# Patient Record
Sex: Male | Born: 1935 | Race: White | Hispanic: No
Health system: Southern US, Community
[De-identification: ages and names within clinical notes are randomized; demographics above are authoritative.]

## PROBLEM LIST (undated history)

## (undated) DIAGNOSIS — H9190 Unspecified hearing loss, unspecified ear: Secondary | ICD-10-CM

## (undated) DIAGNOSIS — R011 Cardiac murmur, unspecified: Secondary | ICD-10-CM

## (undated) DIAGNOSIS — I350 Nonrheumatic aortic (valve) stenosis: Secondary | ICD-10-CM

## (undated) DIAGNOSIS — I77810 Thoracic aortic ectasia: Secondary | ICD-10-CM

## (undated) DIAGNOSIS — R42 Dizziness and giddiness: Secondary | ICD-10-CM

## (undated) DIAGNOSIS — E039 Hypothyroidism, unspecified: Secondary | ICD-10-CM

## (undated) DIAGNOSIS — N400 Enlarged prostate without lower urinary tract symptoms: Secondary | ICD-10-CM

## (undated) DIAGNOSIS — I779 Disorder of arteries and arterioles, unspecified: Secondary | ICD-10-CM

## (undated) DIAGNOSIS — I10 Essential (primary) hypertension: Secondary | ICD-10-CM

## (undated) DIAGNOSIS — R350 Frequency of micturition: Secondary | ICD-10-CM

## (undated) DIAGNOSIS — L740 Miliaria rubra: Secondary | ICD-10-CM

## (undated) DIAGNOSIS — M1990 Unspecified osteoarthritis, unspecified site: Secondary | ICD-10-CM

## (undated) DIAGNOSIS — I2510 Atherosclerotic heart disease of native coronary artery without angina pectoris: Secondary | ICD-10-CM

## (undated) DIAGNOSIS — Z8679 Personal history of other diseases of the circulatory system: Secondary | ICD-10-CM

## (undated) DIAGNOSIS — E785 Hyperlipidemia, unspecified: Secondary | ICD-10-CM

## (undated) DIAGNOSIS — E782 Mixed hyperlipidemia: Secondary | ICD-10-CM

## (undated) HISTORY — DX: Essential (primary) hypertension: I10

## (undated) HISTORY — DX: Personal history of other diseases of the circulatory system: Z86.79

## (undated) HISTORY — DX: Hyperlipidemia, unspecified: E78.5

## (undated) HISTORY — DX: Disorder of arteries and arterioles, unspecified: I77.9

## (undated) HISTORY — DX: Benign prostatic hyperplasia without lower urinary tract symptoms: N40.0

## (undated) HISTORY — DX: Mixed hyperlipidemia: E78.2

## (undated) HISTORY — DX: Hypothyroidism, unspecified: E03.9

## (undated) HISTORY — PX: CARDIAC VALVE REPLACEMENT: SHX585

## (undated) HISTORY — DX: Thoracic aortic ectasia: I77.810

---

## 2007-03-13 ENCOUNTER — Ambulatory Visit: Payer: Self-pay | Admitting: Cardiology

## 2008-06-25 ENCOUNTER — Ambulatory Visit: Payer: Self-pay | Admitting: Urology

## 2008-12-31 DIAGNOSIS — I10 Essential (primary) hypertension: Secondary | ICD-10-CM

## 2008-12-31 DIAGNOSIS — E785 Hyperlipidemia, unspecified: Secondary | ICD-10-CM | POA: Insufficient documentation

## 2010-05-31 HISTORY — PX: CARDIAC CATHETERIZATION: SHX172

## 2010-10-13 NOTE — Assessment & Plan Note (Signed)
[facility] OFFICE NOTE   BELEN, ZWAHLEN                           MRN:          [id number]  DATE:03/13/2007                            DOB:          [birth year]    CHIEF COMPLAINT:  I have got a heart murmur.   HISTORY OF PRESENT ILLNESS:  Mr. Ksiazek is a delightful, 77 year old,  married, white male from [place] who comes today with  the above complaint.  He is totally asymptomatic at present.  He is very  active and very vigorous.  He has no dyspnea on exertion, syncope,  presyncope, tachy palpitations, or angina.  He has no history of valve  disease and no history of rheumatic heart disease.   PAST MEDICAL HISTORY:  He does not smoke, drink, use recreational  products.  His thyroid panel was recently checked and was normal, CBC,  electrolytes included.  His total cholesterol is 205, LDL 149, HDL 37,  triglycerides 97.  He is currently on Lisinopril/HCTZ 20/12.5 daily for  hypertension.  He is on levothyroxine for thyroid replacement at 50 mcg  per day. He has no known drug allergies.   PREVIOUS SURGERIES:  None.   FAMILY HISTORY:  Negative for premature heart disease.   SOCIAL HISTORY:  He is currently retired from [employer].  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  He is married and has  2 children.   REVIEW OF SYSTEMS:  He does have a history of sexual dysfunction and has  thyroid disease and hypertension, otherwise negative review of systems.   PHYSICAL EXAMINATION:  VITAL SIGNS:  His blood pressure is 152/94.  His  pulse is 65 and regular.  He is in a sinus rhythm.  He has got minor  criteria for LVH.  He has nonspecific ST segment changes in I and aVL.  HEENT:  Normocephalic atraumatic.  PERRLA.  Extraocular movements  intact.  Sclerae are clear.  Facial symmetry is normal.  Dentition is  satisfactory.  NECK:  Carotids upstrokes are equal bilaterally with  bilateral referred  sounds.  Thyroid is not enlarged.  Trachea is midline.  There is no JVD.  LUNGS:  Clear to auscultation.  HEART:  Reveals a poorly appreciated PMI.  He has a 3/6 systolic murmur  consistent with aortic stenosis.  I could not hear any aortic  insufficiency.  There is no S4 gallop.  There is no sustained pulse and  there is no delayed carotid up pulse.  His S2 does split  physiologically.  ABDOMEN:  Soft.  Good bowel sounds.  No midline bruit.  EXTREMITIES:  Reveal no cyanosis, clubbing, or edema.  Pulses are  intact.  NEUROLOGIC:  Intact.   ASSESSMENT:  1. Probable mild to moderate aortic stenosis, currently asymptomatic.  2. Hypertension.  3. Mild hyperlipidemia.   He has had a 2D echocardiogram at Dr. Carron Brazen office.  We will obtain  the written report.  Assuming that it is consistent with what I am  hearing today, we will see him back in  6 months and then annually  thereafter.     Thomas C. Daleen Squibb, MD, [facility]  Electronically Signed    TCW/MedQ  DD: 03/13/2007  DT: 03/13/2007  Job #: [id number]   cc:   Evelene Croon

## 2010-11-20 ENCOUNTER — Encounter: Payer: Self-pay | Admitting: Cardiovascular Disease

## 2011-03-15 ENCOUNTER — Ambulatory Visit: Payer: Self-pay | Admitting: Family Medicine

## 2011-03-17 ENCOUNTER — Encounter: Payer: Self-pay | Admitting: Cardiovascular Disease

## 2011-03-18 ENCOUNTER — Ambulatory Visit: Payer: Self-pay | Admitting: Cardiovascular Disease

## 2011-04-01 ENCOUNTER — Ambulatory Visit (INDEPENDENT_AMBULATORY_CARE_PROVIDER_SITE_OTHER): Payer: Medicare Other | Admitting: Cardiovascular Disease

## 2011-04-01 DIAGNOSIS — I350 Nonrheumatic aortic (valve) stenosis: Secondary | ICD-10-CM

## 2011-04-01 DIAGNOSIS — I10 Essential (primary) hypertension: Secondary | ICD-10-CM

## 2011-04-01 DIAGNOSIS — R42 Dizziness and giddiness: Secondary | ICD-10-CM

## 2011-04-01 DIAGNOSIS — I359 Nonrheumatic aortic valve disorder, unspecified: Secondary | ICD-10-CM

## 2011-04-01 DIAGNOSIS — R011 Cardiac murmur, unspecified: Secondary | ICD-10-CM

## 2011-04-01 DIAGNOSIS — I739 Peripheral vascular disease, unspecified: Secondary | ICD-10-CM | POA: Insufficient documentation

## 2011-04-01 MED ORDER — LISINOPRIL 20 MG PO TABS: 20.0000 mg | ORAL_TABLET | Freq: Two times a day (BID) | ORAL | Status: DC

## 2011-04-01 NOTE — Assessment & Plan Note (Signed)
Given the finding of mild to moderate calcific plaquing his carotid, he will probably need to be started on a cholesterol medication with goal LDL less than 70. We did not start this today.

## 2011-04-01 NOTE — Patient Instructions (Addendum)
You are doing well. Please stop the lisinopril HCTZ Start lisinopril 20 mg once a day, increase to twice a day if needed for high blood pressure  Please call us if you have new issues that need to be addressed before your next appt.  The office will contact you for a follow up after the echo  Your physician has requested that you have an echocardiogram. Echocardiography is a painless test that uses sound waves to create images of your heart. It provides your doctor with information about the size and shape of your heart and how well your heart's chambers and valves are working. This procedure takes approximately one hour. There are no restrictions for this procedure.

## 2011-04-01 NOTE — Assessment & Plan Note (Signed)
He does report having episodes of cramping as well as his dizziness. Uncertain if the HCTZ could be contributing to both of these side effects. He does not drink much at baseline raising the concern for orthostasis. We have suggested he try lisinopril 20 mg, possibly taking extra lisinopril if needed for hypertension. Prescription was written for 20 mg b.i.d. With close monitoring of his blood pressure.

## 2011-04-01 NOTE — Progress Notes (Signed)
   Patient ID: Andre Evans, male    DOB: Sep 14, 1933, 77 y.o.   MRN: [id number]  HPI Comments: Andre Evans is a very pleasant 77 year old gentleman, patient of Dr. Burnett Sheng, with a history of hypertension and hypothyroidism with recent episodes of dizziness and murmur as well as carotid bruit.  He reports having a carotid ultrasound that showed mild to moderate calcific plaque on the left with small plaque on the right. No significant stenoses.  He has had episodes of dizziness starting initially several years ago and then again several months ago. Some of these episodes seem to be positional such as when he looks upwards or when he bends over and stands up. The last episode of dizziness was more than one month ago and he describes it as a lightheadedness and not a spinning.   He does not drink much in the daytime and has significant leg cramps at nighttime. Last episode of cramping was 2 weeks ago. His wife is always trying to get him to drink more.   He reports that his blood pressure is sometimes in the 120 range, occasionally lower. He has been more compliant with his lisinopril HCTZ.  Previous EKG shows normal sinus rhythm with nonspecific ST changes to the anterior precordial leads, ST and T wave abnormality in one and aVL, left axis deviation   Outpatient Encounter Prescriptions as of 04/01/2011  Medication Sig Dispense Refill  .  lisinopril-hydrochlorothiazide (PRINZIDE,ZESTORETIC) 20-12.5 MG per tablet Take 1 tablet by mouth 2 (two) times daily.           Review of Systems  Constitutional: Negative.   HENT: Negative.   Eyes: Negative.   Respiratory: Negative.   Cardiovascular: Negative.   Gastrointestinal: Negative.   Musculoskeletal: Positive for myalgias.       Leg cramps  Skin: Negative.   Neurological: Positive for dizziness.  Hematological: Negative.   Psychiatric/Behavioral: Negative.   All other systems reviewed and are negative.    BP 130/84  Pulse 66  Ht 5\' 6"   (1.676 m)  Wt 175 lb 6.4 oz (79.561 kg)  BMI 28.31 kg/m2  Physical Exam  Nursing note and vitals reviewed. Constitutional: He is oriented to person, place, and time. He appears well-developed and well-nourished.  HENT:  Head: Normocephalic.  Nose: Nose normal.  Mouth/Throat: Oropharynx is clear and moist.  Eyes: Conjunctivae are normal. Pupils are equal, round, and reactive to light.  Neck: Normal range of motion. Neck supple. No JVD present. Carotid bruit is present.  Cardiovascular: Normal rate, regular rhythm, S1 normal, S2 normal and intact distal pulses.  Exam reveals no gallop and no friction rub.   Murmur heard.  Crescendo systolic murmur is present with a grade of 3/6  Pulmonary/Chest: Effort normal and breath sounds normal. No respiratory distress. He has no wheezes. He has no rales. He exhibits no tenderness.  Abdominal: Soft. Bowel sounds are normal. He exhibits no distension. There is no tenderness.  Musculoskeletal: Normal range of motion. He exhibits no edema and no tenderness.  Lymphadenopathy:    He has no cervical adenopathy.  Neurological: He is alert and oriented to person, place, and time. Coordination normal.  Skin: Skin is warm and dry. No rash noted. No erythema.  Psychiatric: He has a normal mood and affect. His behavior is normal. Judgment and thought content normal.           Assessment and Plan

## 2011-04-01 NOTE — Assessment & Plan Note (Signed)
Etiology of his dizzy episodes are uncertain. Unable to exclude arrhythmia though his symptoms are very infrequent with last episode more than one month ago. There is a atypical component to it consistent with benign positional vertigo he denies any spinning. I'm also concerned about his murmur which is consistent with aortic valve disease. Echocardiogram has been ordered.  If he continues to have episodes of dizziness, we will probably recommend a Holter monitor ( if his aortic valve disease is not causing his symptoms).

## 2011-04-01 NOTE — Assessment & Plan Note (Signed)
Prominent murmur consistent with aortic valve disease. Echocardiogram has been ordered.

## 2011-04-12 ENCOUNTER — Encounter: Payer: Self-pay | Admitting: Cardiovascular Disease

## 2011-04-13 ENCOUNTER — Other Ambulatory Visit (INDEPENDENT_AMBULATORY_CARE_PROVIDER_SITE_OTHER): Payer: Medicare Other | Admitting: *Deleted

## 2011-04-13 DIAGNOSIS — I359 Nonrheumatic aortic valve disorder, unspecified: Secondary | ICD-10-CM

## 2011-04-13 DIAGNOSIS — R011 Cardiac murmur, unspecified: Secondary | ICD-10-CM

## 2011-04-13 DIAGNOSIS — R42 Dizziness and giddiness: Secondary | ICD-10-CM

## 2011-04-13 DIAGNOSIS — I350 Nonrheumatic aortic (valve) stenosis: Secondary | ICD-10-CM

## 2011-04-20 ENCOUNTER — Encounter: Payer: Self-pay | Admitting: *Deleted

## 2011-04-20 ENCOUNTER — Encounter: Payer: Self-pay | Admitting: Cardiovascular Disease

## 2011-04-20 ENCOUNTER — Ambulatory Visit (INDEPENDENT_AMBULATORY_CARE_PROVIDER_SITE_OTHER): Payer: Medicare Other | Admitting: Cardiovascular Disease

## 2011-04-20 DIAGNOSIS — I10 Essential (primary) hypertension: Secondary | ICD-10-CM

## 2011-04-20 DIAGNOSIS — I359 Nonrheumatic aortic valve disorder, unspecified: Secondary | ICD-10-CM

## 2011-04-20 DIAGNOSIS — E785 Hyperlipidemia, unspecified: Secondary | ICD-10-CM

## 2011-04-20 DIAGNOSIS — I739 Peripheral vascular disease, unspecified: Secondary | ICD-10-CM

## 2011-04-20 DIAGNOSIS — I350 Nonrheumatic aortic (valve) stenosis: Secondary | ICD-10-CM | POA: Insufficient documentation

## 2011-04-20 NOTE — Assessment & Plan Note (Signed)
Recent echocardiogram suggests severe aortic valve stenosis. We have discussed the options with him and we will schedule a transesophageal echo to confirm the stenosis prior to any further workup. If the TEE does show severe stenosis, the next procedure would be a cardiac catheterization prior to any possible valve surgery.

## 2011-04-20 NOTE — Progress Notes (Signed)
   Patient ID: Andre Evans, male    DOB: 23-Mar-1934, 77 y.o.   MRN: [id number]  HPI Comments: Andre Evans is a very pleasant 77 year old gentleman, patient of Dr. Burnett Evans, with a history of hypertension and hypothyroidism with recent episodes of dizziness and murmur as well as carotid bruit. He presents for followup after his echocardiogram.  He reports having a carotid ultrasound that showed mild to moderate calcific plaque on the left with small plaque on the right. No significant stenoses.  We did make some medication changes on his last clinic visit and stop his HCTZ secondary to cramping. He believes that the cramping has improved in his legs.  The echocardiogram did suggest severe aortic valve stenosis with elevated velocity, aortic valve area less than 1 cm. This was reviewed with him and images were shown to him. The valve is very calcified and leaflets are restricted. He denies any significant shortness of breath with exertion. He does not participate in an exercise program. Dizziness and lightheadedness has improved.  Previous EKG shows normal sinus rhythm with nonspecific ST changes to the anterior precordial leads, ST and T wave abnormality in one and aVL, left axis deviation   Outpatient Encounter Prescriptions as of 04/20/2011  Medication Sig Dispense Refill  . lisinopril (PRINIVIL,ZESTRIL) 20 MG tablet Take 1 tablet (20 mg total) by mouth 2 (two) times daily.  60 tablet  11    Review of Systems  Constitutional: Negative.   HENT: Negative.   Eyes: Negative.   Respiratory: Negative.   Cardiovascular: Negative.   Gastrointestinal: Negative.   Musculoskeletal:       Leg cramps  Skin: Negative.   Hematological: Negative.   Psychiatric/Behavioral: Negative.   All other systems reviewed and are negative.    BP 132/82  Pulse 66  Ht 5\' 6"  (1.676 m)  Wt 179 lb (81.194 kg)  BMI 28.89 kg/m2  Physical Exam  Nursing note and vitals reviewed. Constitutional: He is oriented to  person, place, and time. He appears well-developed and well-nourished.  HENT:  Head: Normocephalic.  Nose: Nose normal.  Mouth/Throat: Oropharynx is clear and moist.  Eyes: Conjunctivae are normal. Pupils are equal, round, and reactive to light.  Neck: Normal range of motion. Neck supple. No JVD present. Carotid bruit is present.  Cardiovascular: Normal rate, regular rhythm, S1 normal, S2 normal and intact distal pulses.  Exam reveals no gallop and no friction rub.   Murmur heard.  Crescendo systolic murmur is present with a grade of 3/6  Pulmonary/Chest: Effort normal and breath sounds normal. No respiratory distress. He has no wheezes. He has no rales. He exhibits no tenderness.  Abdominal: Soft. Bowel sounds are normal. He exhibits no distension. There is no tenderness.  Musculoskeletal: Normal range of motion. He exhibits no edema and no tenderness.  Lymphadenopathy:    He has no cervical adenopathy.  Neurological: He is alert and oriented to person, place, and time. Coordination normal.  Skin: Skin is warm and dry. No rash noted. No erythema.  Psychiatric: He has a normal mood and affect. His behavior is normal. Judgment and thought content normal.           Assessment and Plan

## 2011-04-20 NOTE — Assessment & Plan Note (Signed)
Blood pressure is relatively well controlled. If he does have severe aortic valve stenosis, he may benefit from low dose beta blocker.

## 2011-04-20 NOTE — Assessment & Plan Note (Signed)
Mild-to-moderate carotid arterial disease. We'll continue aggressive cholesterol management. Goal LDL less than 70.

## 2011-04-20 NOTE — Assessment & Plan Note (Signed)
Given his underlying carotid arterial disease, he would benefit from low-dose cholesterol medication.

## 2011-04-20 NOTE — Patient Instructions (Addendum)
You are doing well. No medication changes were made.  We will schedule you for a TEE at [facility] next week to look at your aortic valve stenosis. Your physician has requested that you have a TEE. During a TEE, sound waves are used to create images of your heart. It provides your doctor with information about the size and shape of your heart and how well your heart's chambers and valves are working. In this test, a transducer is attached to the end of a flexible tube that's guided down your throat and into your esophagus (the tube leading from you mouth to your stomach) to get a more detailed image of your heart. You are not awake for the procedure. Please see the instruction sheet given to you today. For further information please visit [email or web address]   Please call us if you have new issues that need to be addressed before your next appt.  The office will contact you for a follow up Appt. In 6 months

## 2011-04-28 ENCOUNTER — Encounter: Payer: Self-pay | Admitting: *Deleted

## 2011-04-28 ENCOUNTER — Other Ambulatory Visit: Payer: Self-pay | Admitting: Cardiovascular Disease

## 2011-04-28 ENCOUNTER — Telehealth: Payer: Self-pay | Admitting: *Deleted

## 2011-04-28 ENCOUNTER — Ambulatory Visit: Payer: Self-pay | Admitting: Cardiovascular Disease

## 2011-04-28 DIAGNOSIS — I350 Nonrheumatic aortic (valve) stenosis: Secondary | ICD-10-CM

## 2011-04-28 DIAGNOSIS — I359 Nonrheumatic aortic valve disorder, unspecified: Secondary | ICD-10-CM

## 2011-04-28 DIAGNOSIS — Z01810 Encounter for preprocedural cardiovascular examination: Secondary | ICD-10-CM

## 2011-04-28 NOTE — Telephone Encounter (Signed)
Spoke to pt, notified TEE showed severe aortic stenosis per Dr. Mariah Milling. (He had already discussed this after TEE). I have scheduled pt for LHC at [facility] lab and will refer to Dr. Dorris Fetch for valve sx consult, pt aware to come in this Friday 11/30 for labs and procedure instructions.

## 2011-04-30 ENCOUNTER — Ambulatory Visit (INDEPENDENT_AMBULATORY_CARE_PROVIDER_SITE_OTHER): Payer: Medicare Other | Admitting: *Deleted

## 2011-04-30 ENCOUNTER — Encounter: Payer: Medicare Other | Admitting: *Deleted

## 2011-04-30 DIAGNOSIS — Z01810 Encounter for preprocedural cardiovascular examination: Secondary | ICD-10-CM

## 2011-04-30 DIAGNOSIS — I350 Nonrheumatic aortic (valve) stenosis: Secondary | ICD-10-CM

## 2011-04-30 DIAGNOSIS — I359 Nonrheumatic aortic valve disorder, unspecified: Secondary | ICD-10-CM

## 2011-05-01 LAB — BASIC METABOLIC PANEL
CO2: 24 mmol/L (ref 20–32)
Calcium: 9.1 mg/dL (ref 8.6–10.2)
Chloride: 104 mmol/L (ref 97–108)
Glucose: 89 mg/dL (ref 65–99)
Potassium: 4.1 mmol/L (ref 3.5–5.2)
Sodium: 140 mmol/L (ref 134–144)

## 2011-05-01 LAB — PROTIME-INR: INR: 1 (ref 0.8–1.2)

## 2011-05-04 ENCOUNTER — Other Ambulatory Visit: Payer: Self-pay | Admitting: Cardiovascular Disease

## 2011-05-04 ENCOUNTER — Telehealth: Payer: Self-pay | Admitting: *Deleted

## 2011-05-04 DIAGNOSIS — Z01810 Encounter for preprocedural cardiovascular examination: Secondary | ICD-10-CM

## 2011-05-04 DIAGNOSIS — I350 Nonrheumatic aortic (valve) stenosis: Secondary | ICD-10-CM

## 2011-05-04 DIAGNOSIS — I2510 Atherosclerotic heart disease of native coronary artery without angina pectoris: Secondary | ICD-10-CM

## 2011-05-04 NOTE — Telephone Encounter (Signed)
FYI Dr. Burnett Sheng has ordered CT scan for next Tuesday after pt's cath. 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 This is after his cath.

## 2011-05-05 ENCOUNTER — Encounter (HOSPITAL_BASED_OUTPATIENT_CLINIC_OR_DEPARTMENT_OTHER)
Admission: RE | Disposition: A | Discharge status: Home / Self Care | Payer: Self-pay | Source: Ambulatory Visit | Attending: Cardiovascular Disease

## 2011-05-05 ENCOUNTER — Encounter (HOSPITAL_BASED_OUTPATIENT_CLINIC_OR_DEPARTMENT_OTHER): Payer: Self-pay | Admitting: Cardiovascular Disease

## 2011-05-05 ENCOUNTER — Inpatient Hospital Stay (HOSPITAL_BASED_OUTPATIENT_CLINIC_OR_DEPARTMENT_OTHER)
Admission: RE | Admit: 2011-05-05 | Discharge: 2011-05-06 | Disposition: A | Discharge status: Home / Self Care | Payer: Medicare Other | Source: Ambulatory Visit | Attending: Cardiovascular Disease | Admitting: Cardiovascular Disease

## 2011-05-05 DIAGNOSIS — I2510 Atherosclerotic heart disease of native coronary artery without angina pectoris: Secondary | ICD-10-CM

## 2011-05-05 DIAGNOSIS — I350 Nonrheumatic aortic (valve) stenosis: Secondary | ICD-10-CM

## 2011-05-05 DIAGNOSIS — Z01810 Encounter for preprocedural cardiovascular examination: Secondary | ICD-10-CM

## 2011-05-05 DIAGNOSIS — I359 Nonrheumatic aortic valve disorder, unspecified: Secondary | ICD-10-CM

## 2011-05-05 SURGERY — JV LEFT HEART CATHETERIZATION WITH CORONARY ANGIOGRAM
Anesthesia: Moderate Sedation

## 2011-05-05 MED ORDER — ASPIRIN 81 MG PO CHEW
324.0000 mg | CHEWABLE_TABLET | ORAL | Status: AC
  Administered 2011-05-05: 324 mg via ORAL

## 2011-05-05 MED ORDER — SODIUM CHLORIDE 0.9 % IV SOLN
250.0000 mL | INTRAVENOUS | Status: DC | PRN
Start: 2011-05-05 — End: 2011-05-06

## 2011-05-05 MED ORDER — ONDANSETRON HCL 4 MG/2ML IJ SOLN: 4.0000 mg | Freq: Four times a day (QID) | INTRAMUSCULAR | Status: DC | PRN

## 2011-05-05 MED ORDER — ACETAMINOPHEN 325 MG PO TABS: 650.0000 mg | ORAL_TABLET | ORAL | Status: DC | PRN

## 2011-05-05 NOTE — Op Note (Signed)
Cardiac Catheterization Procedure Note  Name: Andre Evans MRN: [id number] DOB: 1933-06-25  Procedure: Left Heart Cath, Selective Coronary Angiography, LV angiography  Indication:  Severe aortic valve stenosis, preop prior to AVR surgery  Procedural details: The right groin was prepped, draped, and anesthetized with 1% lidocaine. Using modified Seldinger technique, a 5 French sheath was introduced into the right femoral artery. Standard Judkins catheters were used for coronary angiography and left ventriculography. Catheter exchanges were performed over a guidewire. There were no immediate procedural complications. The patient was transferred to the post catheterization recovery area for further monitoring.  Procedural Findings:  Hemodynamics:     AO 126/70/92    LV  Aortic valve was not crossed.   Coronary angiography:  Coronary dominance: Right  Left mainstem:   Large vessel with no significant disease. Bifurcates into the left circumflex and LAD.  Left anterior descending (LAD):   Moderate to large size vessel with several diagonal branches. There appears to be mild narrowing consistent with 30-40% proximal to mid LAD disease. Otherwise mild luminal irregularities.  Left circumflex (LCx):  Dominant vessel , Large vessel with several obtuse marginal branches. Mild luminal irregularities.  Right coronary artery (RCA):  Small nondominant vessel with mild luminal irregularities.  very difficult to visualize secondary to aortic root dilatation. Several catheters were used to engage the ostium of the RCA including a JR 4, no torque catheter and finally JR 5. Significant kinking of the catheters given tortuosity in the right iliac vessel and the dilated descending aorta.   Left ventriculography: Aortic valve was not crossed with a catheter given the severe stenosis. Aortic root: The aortic root, ascending aorta and arch appears dilated  Final Conclusions:   No significant coronary artery  disease. Mild proximal and mid LAD disease estimated at 30%. Mild to Moderately dilated aortic root and ascending aorta.  Severe aortic valve stenosis by history with recent transthoracic echo and transesophageal echo at [facility]. Estimated aortic valve is critical, estimated less than 0.7 cm.  He reports recent carotid ultrasound showing no significant stenoses (done at [facility])  Recommendations:  We'll schedule him for consultation with CT surgery is for a for aortic valve replacement. He is requesting tissue valve to avoid anticoagulation. Our office in [place] we'll help arrange this appointment.  Julien Nordmann 05/05/2011, 1:26 PM

## 2011-05-05 NOTE — H&P (Signed)
Patient ID: Andre Evans, male DOB: 06/10/1933, 77 y.o. MRN: [id number]   HPI Comments: Andre Evans is a very pleasant 77 year old gentleman, patient of Dr. Burnett Sheng, with a history of hypertension and hypothyroidism with recent episodes of dizziness and murmur as well as carotid bruit. He presents for followup after his echocardiogram.  He reports having a carotid ultrasound that showed mild to moderate calcific plaque on the left with small plaque on the right. No significant stenoses.  We did make some medication changes on his last clinic visit and stop his HCTZ secondary to cramping. He believes that the cramping has improved in his legs.  The echocardiogram did suggest severe aortic valve stenosis with elevated velocity, aortic valve area less than 1 cm. This was reviewed with him and images were shown to him. The valve is very calcified and leaflets are restricted. He denies any significant shortness of breath with exertion. He does not participate in an exercise program. Dizziness and lightheadedness has improved.  Previous EKG shows normal sinus rhythm with nonspecific ST changes to the anterior precordial leads, ST and T wave abnormality in one and aVL, left axis deviation   Outpatient Encounter Prescriptions as of 04/20/2011   Medication  Sig  Dispense  Refill   .  lisinopril (PRINIVIL,ZESTRIL) 20 MG tablet  Take 1 tablet (20 mg total) by mouth 2 (two) times daily.  60 tablet  11    Review of Systems  Constitutional: Negative.  HENT: Negative.  Eyes: Negative.  Respiratory: Negative.  Cardiovascular: Negative.  Gastrointestinal: Negative.  Musculoskeletal:  Leg cramps  Skin: Negative.  Hematological: Negative.  Psychiatric/Behavioral: Negative.  All other systems reviewed and are negative.   BP 132/82  Pulse 66  Ht 5\' 6"  (1.676 m)  Wt 179 lb (81.194 kg)  BMI 28.89 kg/m2   Physical Exam  Nursing note and vitals reviewed.  Constitutional: He is oriented to person, place,  and time. He appears well-developed and well-nourished.  HENT:  Head: Normocephalic.  Nose: Nose normal.  Mouth/Throat: Oropharynx is clear and moist.  Eyes: Conjunctivae are normal. Pupils are equal, round, and reactive to light.  Neck: Normal range of motion. Neck supple. No JVD present. Carotid bruit is present.  Cardiovascular: Normal rate, regular rhythm, S1 normal, S2 normal and intact distal pulses. Exam reveals no gallop and no friction rub.  Murmur heard.  Crescendo systolic murmur is present with a grade of 3/6  Pulmonary/Chest: Effort normal and breath sounds normal. No respiratory distress. He has no wheezes. He has no rales. He exhibits no tenderness.  Abdominal: Soft. Bowel sounds are normal. He exhibits no distension. There is no tenderness.  Musculoskeletal: Normal range of motion. He exhibits no edema and no tenderness.  Lymphadenopathy:  He has no cervical adenopathy.  Neurological: He is alert and oriented to person, place, and time. Coordination normal.  Skin: Skin is warm and dry. No rash noted. No erythema.  Psychiatric: He has a normal mood and affect. His behavior is normal. Judgment and thought content normal.      Assessment and Plan   Aortic valve stenosis - Julien Nordmann, MD 04/20/2011 7:51 PM Signed  Recent echocardiogram suggests severe aortic valve stenosis. We have discussed the options with him and we will schedule a transesophageal echo to confirm the stenosis prior to any further workup. If the TEE does show severe stenosis, the next procedure would be a cardiac catheterization prior to any possible valve surgery. HTN (hypertension) - Julien Nordmann, MD 04/20/2011  7:52 PM Signed  Blood pressure is relatively well controlled. If he does have severe aortic valve stenosis, he may benefit from low dose beta blocker. PVD (peripheral vascular disease) - Julien Nordmann, MD 04/20/2011 7:52 PM Signed  Mild-to-moderate carotid arterial disease. We'll continue  aggressive cholesterol management. Goal LDL less than 70. HYPERLIPIDEMIA-MIXED Julien Nordmann, MD 04/20/2011 7:52 PM Signed  Given his underlying carotid arterial disease, he would benefit from low-dose cholesterol medication.    Agree with recent H&P above from the office No changes noted

## 2011-05-05 NOTE — OR Nursing (Signed)
Discharge instructions reviewed and signed, pt stated understanding, ambulated in hall without difficulty, site intact, level 0, no bleeding, transported to wife's car via wheelchair 

## 2011-05-05 NOTE — OR Nursing (Signed)
Tegaderm dressing applied, site intact, level 0, no bleeding, bedrest begins at 1335.

## 2011-05-05 NOTE — OR Nursing (Signed)
Dr Gollan at bedside to discuss results and treatment plan with pt and family 

## 2011-05-06 ENCOUNTER — Encounter: Payer: Self-pay | Admitting: Cardiovascular Disease

## 2011-05-19 ENCOUNTER — Other Ambulatory Visit: Payer: Self-pay

## 2011-05-19 ENCOUNTER — Encounter: Payer: Self-pay | Admitting: Thoracic Surgery (Cardiothoracic Vascular Surgery)

## 2011-05-19 ENCOUNTER — Institutional Professional Consult (permissible substitution) (INDEPENDENT_AMBULATORY_CARE_PROVIDER_SITE_OTHER): Payer: Medicare Other | Admitting: Thoracic Surgery (Cardiothoracic Vascular Surgery)

## 2011-05-19 ENCOUNTER — Other Ambulatory Visit: Payer: Self-pay | Admitting: Thoracic Surgery (Cardiothoracic Vascular Surgery)

## 2011-05-19 VITALS — BP 132/82 | HR 70 | Resp 16 | Ht 66.0 in | Wt 175.0 lb

## 2011-05-19 DIAGNOSIS — I359 Nonrheumatic aortic valve disorder, unspecified: Secondary | ICD-10-CM

## 2011-05-19 DIAGNOSIS — I350 Nonrheumatic aortic (valve) stenosis: Secondary | ICD-10-CM | POA: Insufficient documentation

## 2011-05-19 DIAGNOSIS — I2510 Atherosclerotic heart disease of native coronary artery without angina pectoris: Secondary | ICD-10-CM

## 2011-05-19 DIAGNOSIS — E049 Nontoxic goiter, unspecified: Secondary | ICD-10-CM

## 2011-05-19 NOTE — Progress Notes (Signed)
PCP is No primary provider on file. Referring Provider is Mariah Milling, Tollie Pizza, MD  Chief Complaint  Patient presents with  . Aortic Stenosis    eval and treat---ECHO,TEE, HEART CATH    HPI: 77 year old gentleman with a history of heart murmur. Has been followed for this for several years. Recently been experiencing lightheadedness and dizziness with exertion he noted this particular after he had carried some heavy objects. He is also noted some shortness of breath with exertion recently which is new as well. He saw Dr. Burnett Sheng and Dr. Mariah Milling. An echocardiogram revealed progression of his aortic stenosis. TTE suggested the valve area was less than 1 cm. TEE showed a valve area of 0.7 cm. There was no other significant valvular pathology. Left ventricular function was preserved. He had cardiac catheterization done by Dr. Mariah Milling, which showed no hemodynamically significant coronary artery disease, there was some mild plaquing. Interestingly, the patient says that over the past week or so he has not had any additional symptoms.   Past Medical History  Diagnosis Date  . HTN (hypertension)     unspecified  . HLD (hyperlipidemia)     mixed  . Hemorrhoids   . HTN (hypertension)   . Hypothyroidism   . History of cardiac murmur     with a leaky valve  . BPH (benign prostatic hyperplasia)     No past surgical history on file.  Family History  Problem Relation Age of Onset  . Coronary artery disease      family hx  . Diabetes      family hx  . Heart failure      family hx - CHF  . Diabetes Father   . Hypertension Mother     Social History History  Substance Use Topics  . Smoking status: Never Smoker   . Smokeless tobacco: Not on file   Comment: tobacco use - no  . Alcohol Use: No    Current Outpatient Prescriptions  Medication Sig Dispense Refill  . Alfalfa TABS Take by mouth.        . Calcium-Magnesium-Zinc 1000-400-15 MG TABS Take by mouth.        . Chlorophyll POWD by Does  not apply route.        Marland Kitchen co-enzyme Q-10 30 MG capsule Take 30 mg by mouth 3 (three) times daily.        . fish oil-omega-3 fatty acids 1000 MG capsule Take 1 g by mouth daily.        . Multiple Vitamins-Minerals (ANTIOXIDANTS PROTECTOR) TABS Take by mouth.        . RED YEAST RICE EXTRACT PO Take by mouth.        Marland Kitchen lisinopril (PRINIVIL,ZESTRIL) 20 MG tablet Take 1 tablet (20 mg total) by mouth 2 (two) times daily.  60 tablet  11    No Known Allergies  Review of Systems  Constitutional: Negative.   HENT: Negative.   Eyes: Negative.   Respiratory: Positive for shortness of breath (with exertion). Negative for cough, chest tightness, wheezing and stridor.   Cardiovascular: Negative for chest pain, palpitations and leg swelling.       Heart murmur SOB with exertion   Gastrointestinal:       Constipation  Genitourinary:       Enlarged prostate  Neurological: Positive for dizziness and light-headedness. Negative for syncope.  All other systems reviewed and are negative.    BP 132/82  Pulse 70  Resp 16  Ht 5\' 6"  (1.676  m)  Wt 175 lb (79.379 kg)  BMI 28.25 kg/m2  SpO2 94% Physical Exam  Vitals reviewed. Constitutional: He is oriented to person, place, and time. He appears well-developed and well-nourished. No distress.  HENT:  Head: Normocephalic and atraumatic.  Eyes: EOM are normal. Pupils are equal, round, and reactive to light.  Neck: Neck supple. No tracheal deviation present. No thyromegaly present.       Bilateral bruits v. Transmitted murmur  Cardiovascular: Normal rate and regular rhythm.   Murmur (3/6 c/d high-pitched murmur RUSB> remainder of precordium) heard. Pulmonary/Chest: Effort normal and breath sounds normal. He has no wheezes. He has no rales.  Abdominal: Soft. Bowel sounds are normal.  Musculoskeletal: Normal range of motion. He exhibits no edema.  Lymphadenopathy:    He has no cervical adenopathy.  Neurological: He is alert and oriented to person,  place, and time.       No focal deficits  Skin: Skin is warm and dry.  Psychiatric: He has a normal mood and affect.     Diagnostic Tests: TEE and catheterization reviewed, findings as previously noted.  Impression: 77 year old gentleman with severe aortic stenosis which is now symptomatic. His valve area is 0.7 cm by TEE. Aortic valve replacement is indicated for survival benefit and relief of symptoms. Given his age tissue valve would be the preferred alternative. I did discuss with he and his family the relative advantages and disadvantages of tissue and mechanical valves. He strongly wishes to avoid Coumadin and strongly favors tissue valve.  I have discussed with the patient and his family the general nature of the procedure, need for general anesthesia,and incisions to be used. We did discuss the option of a regular full sternotomy versus a more minimally invasive approach, such as a partial sternotomy or parasternal incision. He favors a conventional approach. I have discussed the expected hospital stay, overall recovery and short and long term outcomes. They understand there is an excellent chance of procedural success They understand the risks include but are not limited to death, stroke, MI, DVT/PE, bleeding, possible need for transfusion, infections,other organ system dysfunction including respiratory, renal, or GI complications. They also understand there is approximately 1% chance of complete heart block requiring permanent pacemaker placement. He understands and accepts these risks and agrees to proceed.   Plan: Aortic valve replacement on Tuesday, January 8. Patient prefers a tissue valve.

## 2011-05-21 ENCOUNTER — Encounter (HOSPITAL_COMMUNITY): Payer: Self-pay | Admitting: Pharmacy Technician

## 2011-06-04 ENCOUNTER — Encounter (HOSPITAL_COMMUNITY)
Admission: RE | Admit: 2011-06-04 | Discharge: 2011-06-04 | Disposition: A | Discharge status: Home / Self Care | Payer: Medicare Other | Source: Ambulatory Visit | Attending: Thoracic Surgery (Cardiothoracic Vascular Surgery) | Admitting: Thoracic Surgery (Cardiothoracic Vascular Surgery)

## 2011-06-04 ENCOUNTER — Inpatient Hospital Stay (HOSPITAL_COMMUNITY)
Admission: RE | Admit: 2011-06-04 | Discharge: 2011-06-04 | Disposition: A | Discharge status: Home / Self Care | Payer: Medicare Other | Source: Ambulatory Visit | Attending: Thoracic Surgery (Cardiothoracic Vascular Surgery) | Admitting: Thoracic Surgery (Cardiothoracic Vascular Surgery)

## 2011-06-04 ENCOUNTER — Encounter (HOSPITAL_COMMUNITY): Payer: Self-pay

## 2011-06-04 ENCOUNTER — Ambulatory Visit (HOSPITAL_COMMUNITY)
Admission: RE | Admit: 2011-06-04 | Discharge: 2011-06-04 | Disposition: A | Discharge status: Home / Self Care | Payer: Medicare Other | Source: Ambulatory Visit | Attending: Thoracic Surgery (Cardiothoracic Vascular Surgery) | Admitting: Thoracic Surgery (Cardiothoracic Vascular Surgery)

## 2011-06-04 ENCOUNTER — Other Ambulatory Visit: Payer: Self-pay

## 2011-06-04 DIAGNOSIS — I517 Cardiomegaly: Secondary | ICD-10-CM | POA: Insufficient documentation

## 2011-06-04 DIAGNOSIS — I10 Essential (primary) hypertension: Secondary | ICD-10-CM | POA: Insufficient documentation

## 2011-06-04 DIAGNOSIS — Z01810 Encounter for preprocedural cardiovascular examination: Secondary | ICD-10-CM | POA: Insufficient documentation

## 2011-06-04 DIAGNOSIS — Z01812 Encounter for preprocedural laboratory examination: Secondary | ICD-10-CM | POA: Insufficient documentation

## 2011-06-04 DIAGNOSIS — Z01818 Encounter for other preprocedural examination: Secondary | ICD-10-CM | POA: Insufficient documentation

## 2011-06-04 DIAGNOSIS — I359 Nonrheumatic aortic valve disorder, unspecified: Secondary | ICD-10-CM

## 2011-06-04 DIAGNOSIS — I350 Nonrheumatic aortic (valve) stenosis: Secondary | ICD-10-CM

## 2011-06-04 HISTORY — DX: Nonrheumatic aortic (valve) stenosis: I35.0

## 2011-06-04 HISTORY — DX: Frequency of micturition: R35.0

## 2011-06-04 HISTORY — DX: Dizziness and giddiness: R42

## 2011-06-04 HISTORY — DX: Unspecified hearing loss, unspecified ear: H91.90

## 2011-06-04 HISTORY — DX: Atherosclerotic heart disease of native coronary artery without angina pectoris: I25.10

## 2011-06-04 HISTORY — DX: Miliaria rubra: L74.0

## 2011-06-04 HISTORY — DX: Unspecified osteoarthritis, unspecified site: M19.90

## 2011-06-04 HISTORY — DX: Cardiac murmur, unspecified: R01.1

## 2011-06-04 LAB — URINALYSIS, ROUTINE W REFLEX MICROSCOPIC
Leukocytes, UA: NEGATIVE
Nitrite: NEGATIVE
Specific Gravity, Urine: 1.025 (ref 1.005–1.030)
Urobilinogen, UA: 0.2 mg/dL (ref 0.0–1.0)

## 2011-06-04 LAB — CBC
MCV: 86.3 fL (ref 78.0–100.0)
Platelets: 135 10*3/uL — ABNORMAL LOW (ref 150–400)
RBC: 4.89 MIL/uL (ref 4.22–5.81)
WBC: 5.1 10*3/uL (ref 4.0–10.5)

## 2011-06-04 LAB — COMPREHENSIVE METABOLIC PANEL
ALT: 18 U/L (ref 0–53)
AST: 18 U/L (ref 0–37)
Alkaline Phosphatase: 61 U/L (ref 39–117)
CO2: 23 mEq/L (ref 19–32)
Chloride: 107 mEq/L (ref 96–112)
Creatinine, Ser: 0.87 mg/dL (ref 0.50–1.35)
GFR calc non Af Amer: 82 mL/min — ABNORMAL LOW (ref >90)
Total Bilirubin: 0.7 mg/dL (ref 0.3–1.2)

## 2011-06-04 LAB — SURGICAL PCR SCREEN
MRSA, PCR: NEGATIVE
Staphylococcus aureus: NEGATIVE

## 2011-06-04 LAB — TYPE AND SCREEN: Antibody Screen: NEGATIVE

## 2011-06-04 LAB — BLOOD GAS, ARTERIAL
Acid-base deficit: 0.1 mmol/L (ref 0.0–2.0)
Drawn by: 344381
TCO2: 24.8 mmol/L (ref 0–100)
pCO2 arterial: 36.2 mmHg (ref 35.0–45.0)
pO2, Arterial: 94.3 mmHg (ref 80.0–100.0)

## 2011-06-04 LAB — APTT: aPTT: 27 seconds (ref 24–37)

## 2011-06-04 NOTE — Progress Notes (Signed)
Heart cath in epic

## 2011-06-04 NOTE — Progress Notes (Signed)
VASCULAR LAB PRELIMINARY  PRELIMINARY  PRELIMINARY  PRELIMINARY  Pre-op Cardiac Surgery  Carotid Findings:  Study done at [facility] 03-15-11.  Right:  Mild plaque at bifurcation.  Peak ICA velocity 58.5cm/sec.  Left:  Mild to moderate plaque at bifurcation.  Peak ICA velocity 68.2cm/sec.  OK not to repeat study per Revonda Standard Tilley/TCTS    Upper Extremity Right Left  Brachial Pressures 159 triphasic 162 triphasic  Radial Waveforms triphasic triphasic  Ulnar Waveforms triphasic triphasic  Palmar Arch (Allen's Test) Within normal limits  Within normal limits     Terance Hart, RVT 06/04/2011, 1:00 PM

## 2011-06-04 NOTE — Progress Notes (Signed)
1200-dopplers done 1300-PFT done

## 2011-06-04 NOTE — Pre-Procedure Instructions (Signed)
20 WRANGLER PENNING  06/04/2011   Your procedure is scheduled on:  Tues,Jan 8 @ 0730 Report to [facility] at 0530 AM.  Call this number if you have problems the morning of surgery: [phone]   Remember:   Do not eat food:After Midnight.  May have clear liquids: up to 4 Hours before arrival.(until 1:30 am)  Clear liquids include soda, tea, black coffee, apple or grape juice, broth.  Take these medicines the morning of surgery with A SIP OF WATER:    Do not wear jewelry, make-up or nail polish.  Do not wear lotions, powders, or perfumes. You may wear deodorant.  Do not shave 48 hours prior to surgery.  Do not bring valuables to the hospital.  Contacts, dentures or bridgework may not be worn into surgery.  Leave suitcase in the car. After surgery it may be brought to your room.  For patients admitted to the hospital, checkout time is 11:00 AM the day of discharge.   Patients discharged the day of surgery will not be allowed to drive home.  Name and phone number of your driver:   Special Instructions: CHG Shower Use Special Wash: 1/2 bottle night before surgery and 1/2 bottle morning of surgery.   Please read over the following fact sheets that you were given: Pain Booklet, Coughing and Deep Breathing, Blood Transfusion Information, Open Heart Packet, MRSA Information and Surgical Site Infection Prevention

## 2011-06-04 NOTE — Progress Notes (Signed)
In [place] with [facility]-Dr.Gollan-last visit 3wks ago  Echo in epic Never had a stress test

## 2011-06-05 LAB — HEMOGLOBIN A1C: Mean Plasma Glucose: 120 mg/dL — ABNORMAL HIGH (ref <117)

## 2011-06-07 MED ORDER — MAGNESIUM SULFATE 50 % IJ SOLN
40.0000 meq | INTRAMUSCULAR | Status: DC
  Filled 2011-06-07: qty 10

## 2011-06-07 MED ORDER — NITROGLYCERIN IN D5W 200-5 MCG/ML-% IV SOLN
2.0000 ug/min | INTRAVENOUS | Status: DC
  Filled 2011-06-07: qty 250

## 2011-06-07 MED ORDER — DEXTROSE 5 % IV SOLN
750.0000 mg | INTRAVENOUS | Status: DC
  Filled 2011-06-07: qty 750

## 2011-06-07 MED ORDER — DEXTROSE 5 % IV SOLN
1.5000 g | INTRAVENOUS | Status: AC
  Administered 2011-06-08: 1.5 g via INTRAVENOUS
  Administered 2011-06-08: .75 g via INTRAVENOUS
  Filled 2011-06-07: qty 1.5

## 2011-06-07 MED ORDER — PHENYLEPHRINE HCL 10 MG/ML IJ SOLN
30.0000 ug/min | INTRAVENOUS | Status: AC
  Administered 2011-06-08: 25 ug/min via INTRAVENOUS
  Filled 2011-06-07: qty 2

## 2011-06-07 MED ORDER — METOPROLOL TARTRATE 12.5 MG HALF TABLET
12.5000 mg | ORAL_TABLET | Freq: Once | ORAL | Status: AC
  Administered 2011-06-08: 12.5 mg via ORAL
  Filled 2011-06-07: qty 1

## 2011-06-07 MED ORDER — POTASSIUM CHLORIDE 2 MEQ/ML IV SOLN
80.0000 meq | INTRAVENOUS | Status: DC
  Filled 2011-06-07: qty 40

## 2011-06-07 MED ORDER — SODIUM CHLORIDE 0.9 % IV SOLN
0.1000 ug/kg/h | INTRAVENOUS | Status: AC
  Administered 2011-06-08: 0.5 ug/kg/h via INTRAVENOUS
  Filled 2011-06-07: qty 4

## 2011-06-07 MED ORDER — SODIUM CHLORIDE 0.9 % IV SOLN
INTRAVENOUS | Status: DC
  Filled 2011-06-07: qty 40

## 2011-06-07 MED ORDER — SODIUM CHLORIDE 0.9 % IV SOLN
INTRAVENOUS | Status: AC
  Administered 2011-06-08: 1 [IU]/h via INTRAVENOUS
  Filled 2011-06-07: qty 1

## 2011-06-07 MED ORDER — PAPAVERINE HCL 30 MG/ML IJ SOLN
INTRAMUSCULAR | Status: AC
  Administered 2011-06-08: 09:00:00
  Filled 2011-06-07: qty 0.5

## 2011-06-07 MED ORDER — DOPAMINE-DEXTROSE 3.2-5 MG/ML-% IV SOLN
2.0000 ug/kg/min | INTRAVENOUS | Status: DC
  Filled 2011-06-07: qty 250

## 2011-06-07 MED ORDER — EPINEPHRINE HCL 1 MG/ML IJ SOLN
0.5000 ug/min | INTRAMUSCULAR | Status: DC
  Filled 2011-06-07: qty 4

## 2011-06-07 MED ORDER — VANCOMYCIN HCL 1000 MG IV SOLR
1250.0000 mg | INTRAVENOUS | Status: AC
  Administered 2011-06-08: 1250 mg via INTRAVENOUS
  Filled 2011-06-07: qty 1250

## 2011-06-08 ENCOUNTER — Ambulatory Visit (HOSPITAL_COMMUNITY): Payer: Medicare Other | Admitting: Anesthesiology

## 2011-06-08 ENCOUNTER — Encounter (HOSPITAL_COMMUNITY): Payer: Self-pay | Admitting: Anesthesiology

## 2011-06-08 ENCOUNTER — Encounter (HOSPITAL_COMMUNITY)
Admission: RE | Disposition: A | Discharge status: Home / Self Care | Payer: Self-pay | Source: Ambulatory Visit | Attending: Thoracic Surgery (Cardiothoracic Vascular Surgery)

## 2011-06-08 ENCOUNTER — Inpatient Hospital Stay (HOSPITAL_COMMUNITY): Payer: Medicare Other

## 2011-06-08 ENCOUNTER — Inpatient Hospital Stay (HOSPITAL_COMMUNITY)
Admission: RE | Admit: 2011-06-08 | Discharge: 2011-06-13 | DRG: 220 | Disposition: A | Discharge status: Home / Self Care | Payer: Medicare Other | Source: Ambulatory Visit | Attending: Thoracic Surgery (Cardiothoracic Vascular Surgery) | Admitting: Thoracic Surgery (Cardiothoracic Vascular Surgery)

## 2011-06-08 ENCOUNTER — Other Ambulatory Visit: Payer: Self-pay | Admitting: Thoracic Surgery (Cardiothoracic Vascular Surgery)

## 2011-06-08 ENCOUNTER — Encounter (HOSPITAL_COMMUNITY): Payer: Self-pay | Admitting: *Deleted

## 2011-06-08 DIAGNOSIS — I10 Essential (primary) hypertension: Secondary | ICD-10-CM | POA: Diagnosis present

## 2011-06-08 DIAGNOSIS — Z8249 Family history of ischemic heart disease and other diseases of the circulatory system: Secondary | ICD-10-CM

## 2011-06-08 DIAGNOSIS — Z79899 Other long term (current) drug therapy: Secondary | ICD-10-CM

## 2011-06-08 DIAGNOSIS — N400 Enlarged prostate without lower urinary tract symptoms: Secondary | ICD-10-CM | POA: Diagnosis present

## 2011-06-08 DIAGNOSIS — E039 Hypothyroidism, unspecified: Secondary | ICD-10-CM | POA: Diagnosis present

## 2011-06-08 DIAGNOSIS — D62 Acute posthemorrhagic anemia: Secondary | ICD-10-CM | POA: Diagnosis not present

## 2011-06-08 DIAGNOSIS — K5900 Constipation, unspecified: Secondary | ICD-10-CM | POA: Diagnosis not present

## 2011-06-08 DIAGNOSIS — Z7901 Long term (current) use of anticoagulants: Secondary | ICD-10-CM

## 2011-06-08 DIAGNOSIS — I359 Nonrheumatic aortic valve disorder, unspecified: Principal | ICD-10-CM | POA: Diagnosis present

## 2011-06-08 DIAGNOSIS — Z7982 Long term (current) use of aspirin: Secondary | ICD-10-CM

## 2011-06-08 DIAGNOSIS — D696 Thrombocytopenia, unspecified: Secondary | ICD-10-CM | POA: Diagnosis not present

## 2011-06-08 DIAGNOSIS — E8779 Other fluid overload: Secondary | ICD-10-CM | POA: Diagnosis not present

## 2011-06-08 DIAGNOSIS — E785 Hyperlipidemia, unspecified: Secondary | ICD-10-CM | POA: Diagnosis present

## 2011-06-08 DIAGNOSIS — Z833 Family history of diabetes mellitus: Secondary | ICD-10-CM

## 2011-06-08 HISTORY — PX: AORTIC VALVE REPLACEMENT: SHX41

## 2011-06-08 LAB — POCT I-STAT 3, ART BLOOD GAS (G3+)
Acid-base deficit: 2 mmol/L (ref 0.0–2.0)
Acid-base deficit: 4 mmol/L — ABNORMAL HIGH (ref 0.0–2.0)
Acid-base deficit: 4 mmol/L — ABNORMAL HIGH (ref 0.0–2.0)
Bicarbonate: 21.3 mEq/L (ref 20.0–24.0)
Bicarbonate: 21.8 mEq/L (ref 20.0–24.0)
Bicarbonate: 22.5 mEq/L (ref 20.0–24.0)
O2 Saturation: 95 %
O2 Saturation: 96 %
O2 Saturation: 98 %
Patient temperature: 35.5
TCO2: 22 mmol/L (ref 0–100)
TCO2: 23 mmol/L (ref 0–100)
TCO2: 30 mmol/L (ref 0–100)
pCO2 arterial: 33.4 mmHg — ABNORMAL LOW (ref 35.0–45.0)
pH, Arterial: 7.55 — ABNORMAL HIGH (ref 7.350–7.450)
pO2, Arterial: 101 mmHg — ABNORMAL HIGH (ref 80.0–100.0)
pO2, Arterial: 81 mmHg (ref 80.0–100.0)
pO2, Arterial: 88 mmHg (ref 80.0–100.0)

## 2011-06-08 LAB — POCT I-STAT, CHEM 8
Chloride: 108 mEq/L (ref 96–112)
Glucose, Bld: 116 mg/dL — ABNORMAL HIGH (ref 70–99)
HCT: 30 % — ABNORMAL LOW (ref 39.0–52.0)
Potassium: 4.1 mEq/L (ref 3.5–5.1)

## 2011-06-08 LAB — POCT I-STAT 4, (NA,K, GLUC, HGB,HCT)
Glucose, Bld: 100 mg/dL — ABNORMAL HIGH (ref 70–99)
Glucose, Bld: 120 mg/dL — ABNORMAL HIGH (ref 70–99)
HCT: 36 % — ABNORMAL LOW (ref 39.0–52.0)
HCT: 31 % — ABNORMAL LOW (ref 39.0–52.0)
HCT: 34 % — ABNORMAL LOW (ref 39.0–52.0)
Hemoglobin: 10.5 g/dL — ABNORMAL LOW (ref 13.0–17.0)
Hemoglobin: 8.5 g/dL — ABNORMAL LOW (ref 13.0–17.0)
Potassium: 3.8 mEq/L (ref 3.5–5.1)
Potassium: 3.6 mEq/L (ref 3.5–5.1)
Potassium: 4.7 mEq/L (ref 3.5–5.1)
Potassium: 4.1 mEq/L (ref 3.5–5.1)
Sodium: 142 mEq/L (ref 135–145)
Sodium: 142 mEq/L (ref 135–145)
Sodium: 140 mEq/L (ref 135–145)

## 2011-06-08 LAB — CBC
HCT: 31.8 % — ABNORMAL LOW (ref 39.0–52.0)
Hemoglobin: 11.1 g/dL — ABNORMAL LOW (ref 13.0–17.0)
Hemoglobin: 10.9 g/dL — ABNORMAL LOW (ref 13.0–17.0)
MCH: 29.7 pg (ref 26.0–34.0)
MCH: 29.9 pg (ref 26.0–34.0)
MCHC: 34.3 g/dL (ref 30.0–36.0)
MCV: 86.9 fL (ref 78.0–100.0)
MCV: 87.1 fL (ref 78.0–100.0)
Platelets: 62 10*3/uL — ABNORMAL LOW (ref 150–400)
RBC: 3.74 MIL/uL — ABNORMAL LOW (ref 4.22–5.81)
RDW: 13.3 % (ref 11.5–15.5)
WBC: 7.8 10*3/uL (ref 4.0–10.5)

## 2011-06-08 LAB — PROTIME-INR: Prothrombin Time: 21.4 seconds — ABNORMAL HIGH (ref 11.6–15.2)

## 2011-06-08 LAB — GLUCOSE, CAPILLARY
Glucose-Capillary: 109 mg/dL — ABNORMAL HIGH (ref 70–99)
Glucose-Capillary: 114 mg/dL — ABNORMAL HIGH (ref 70–99)
Glucose-Capillary: 122 mg/dL — ABNORMAL HIGH (ref 70–99)
Glucose-Capillary: 130 mg/dL — ABNORMAL HIGH (ref 70–99)

## 2011-06-08 LAB — HEMOGLOBIN AND HEMATOCRIT, BLOOD: HCT: 30.1 % — ABNORMAL LOW (ref 39.0–52.0)

## 2011-06-08 LAB — APTT: aPTT: 44 seconds — ABNORMAL HIGH (ref 24–37)

## 2011-06-08 SURGERY — REPLACEMENT, AORTIC VALVE, OPEN
Anesthesia: General | Site: Chest | Wound class: Clean

## 2011-06-08 MED ORDER — ACETAMINOPHEN 500 MG PO TABS: 1000.0000 mg | ORAL_TABLET | Freq: Four times a day (QID) | ORAL | Status: DC

## 2011-06-08 MED ORDER — METOPROLOL TARTRATE 12.5 MG HALF TABLET
12.5000 mg | ORAL_TABLET | Freq: Two times a day (BID) | ORAL | Status: DC
  Administered 2011-06-09 – 2011-06-13 (×7): 12.5 mg via ORAL
  Filled 2011-06-08 (×11): qty 1

## 2011-06-08 MED ORDER — FAMOTIDINE IN NACL 20-0.9 MG/50ML-% IV SOLN
20.0000 mg | Freq: Two times a day (BID) | INTRAVENOUS | Status: AC
  Administered 2011-06-08: 20 mg via INTRAVENOUS

## 2011-06-08 MED ORDER — SODIUM CHLORIDE 0.9 % IV SOLN
10.0000 g | INTRAVENOUS | Status: DC | PRN
  Administered 2011-06-08: 5 g/h via INTRAVENOUS

## 2011-06-08 MED ORDER — MIDAZOLAM HCL 5 MG/5ML IJ SOLN
INTRAMUSCULAR | Status: DC | PRN
  Administered 2011-06-08 (×2): 2 mg via INTRAVENOUS
  Administered 2011-06-08: 5 mg via INTRAVENOUS
  Administered 2011-06-08: 3 mg via INTRAVENOUS

## 2011-06-08 MED ORDER — FENTANYL CITRATE 0.05 MG/ML IJ SOLN
INTRAMUSCULAR | Status: DC | PRN
  Administered 2011-06-08: 200 ug via INTRAVENOUS
  Administered 2011-06-08: 1000 ug via INTRAVENOUS
  Administered 2011-06-08: 50 ug via INTRAVENOUS

## 2011-06-08 MED ORDER — DOCUSATE SODIUM 100 MG PO CAPS
200.0000 mg | ORAL_CAPSULE | Freq: Every day | ORAL | Status: DC
  Administered 2011-06-09 – 2011-06-12 (×3): 200 mg via ORAL
  Filled 2011-06-08 (×3): qty 2

## 2011-06-08 MED ORDER — BISACODYL 5 MG PO TBEC
10.0000 mg | DELAYED_RELEASE_TABLET | Freq: Every day | ORAL | Status: DC
  Administered 2011-06-09 – 2011-06-10 (×2): 10 mg via ORAL
  Filled 2011-06-08: qty 2

## 2011-06-08 MED ORDER — SODIUM CHLORIDE 0.45 % IV SOLN
INTRAVENOUS | Status: DC
  Administered 2011-06-08: 20 mL/h via INTRAVENOUS

## 2011-06-08 MED ORDER — THERA M PLUS PO TABS
1.0000 | ORAL_TABLET | Freq: Every day | ORAL | Status: DC
  Administered 2011-06-09 – 2011-06-13 (×5): 1 via ORAL
  Filled 2011-06-08 (×5): qty 1

## 2011-06-08 MED ORDER — BISACODYL 10 MG RE SUPP
10.0000 mg | Freq: Every day | RECTAL | Status: DC
Start: 2011-06-09 — End: 2011-06-13

## 2011-06-08 MED ORDER — SODIUM CHLORIDE 0.9 % IV SOLN
INTRAVENOUS | Status: DC
  Administered 2011-06-08: 10 mL/h via INTRAVENOUS

## 2011-06-08 MED ORDER — PHENYLEPHRINE HCL 10 MG/ML IJ SOLN: 0.0000 ug/min | INTRAVENOUS | Status: DC

## 2011-06-08 MED ORDER — LACTATED RINGERS IV SOLN
INTRAVENOUS | Status: DC
  Administered 2011-06-08: 20 mL/h via INTRAVENOUS

## 2011-06-08 MED ORDER — OXYCODONE HCL 5 MG PO TABS
5.0000 mg | ORAL_TABLET | ORAL | Status: DC | PRN
  Administered 2011-06-09 – 2011-06-10 (×3): 10 mg via ORAL
  Filled 2011-06-08: qty 2
  Filled 2011-06-08: qty 1
  Filled 2011-06-08 (×2): qty 2

## 2011-06-08 MED ORDER — LACTATED RINGERS IV SOLN
INTRAVENOUS | Status: DC | PRN
  Administered 2011-06-08 (×3): via INTRAVENOUS

## 2011-06-08 MED ORDER — ALBUMIN HUMAN 5 % IV SOLN
12.5000 g | Freq: Once | INTRAVENOUS | Status: AC
  Administered 2011-06-08: 12.5 g via INTRAVENOUS
  Filled 2011-06-08: qty 500

## 2011-06-08 MED ORDER — ACETAMINOPHEN 160 MG/5ML PO SOLN
975.0000 mg | Freq: Four times a day (QID) | ORAL | Status: DC
  Filled 2011-06-08: qty 40.6

## 2011-06-08 MED ORDER — METOPROLOL TARTRATE 1 MG/ML IV SOLN: 2.5000 mg | INTRAVENOUS | Status: DC | PRN

## 2011-06-08 MED ORDER — ROCURONIUM BROMIDE 100 MG/10ML IV SOLN
INTRAVENOUS | Status: DC | PRN
  Administered 2011-06-08 (×2): 50 mg via INTRAVENOUS
  Administered 2011-06-08: 100 mg via INTRAVENOUS

## 2011-06-08 MED ORDER — ALBUMIN HUMAN 5 % IV SOLN
250.0000 mL | INTRAVENOUS | Status: AC | PRN
  Administered 2011-06-08 (×2): 250 mL via INTRAVENOUS

## 2011-06-08 MED ORDER — LACTATED RINGERS IV SOLN
INTRAVENOUS | Status: DC | PRN
  Administered 2011-06-08: 07:00:00 via INTRAVENOUS

## 2011-06-08 MED ORDER — ALBUMIN HUMAN 5 % IV SOLN
12.5000 g | Freq: Once | INTRAVENOUS | Status: AC
  Administered 2011-06-08: 12.5 g via INTRAVENOUS

## 2011-06-08 MED ORDER — PANTOPRAZOLE SODIUM 40 MG PO TBEC
40.0000 mg | DELAYED_RELEASE_TABLET | Freq: Every day | ORAL | Status: DC
  Administered 2011-06-10 – 2011-06-12 (×3): 40 mg via ORAL
  Filled 2011-06-08 (×3): qty 1

## 2011-06-08 MED ORDER — SODIUM CHLORIDE 0.9 % IV SOLN
INTRAVENOUS | Status: DC
  Administered 2011-06-08: 0.8 [IU]/h via INTRAVENOUS
  Administered 2011-06-08: 1 [IU]/h via INTRAVENOUS
  Administered 2011-06-08: 1.1 [IU] via INTRAVENOUS

## 2011-06-08 MED ORDER — POTASSIUM CHLORIDE 10 MEQ/50ML IV SOLN: 10.0000 meq | INTRAVENOUS | Status: AC

## 2011-06-08 MED ORDER — ONDANSETRON HCL 4 MG/2ML IJ SOLN: 4.0000 mg | Freq: Four times a day (QID) | INTRAMUSCULAR | Status: DC | PRN

## 2011-06-08 MED ORDER — SODIUM CHLORIDE 0.9 % IV SOLN
0.1000 ug/kg/h | INTRAVENOUS | Status: DC
  Filled 2011-06-08: qty 2

## 2011-06-08 MED ORDER — 0.9 % SODIUM CHLORIDE (POUR BTL) OPTIME
TOPICAL | Status: DC | PRN
  Administered 2011-06-08: 6000 mL

## 2011-06-08 MED ORDER — ACETAMINOPHEN 650 MG RE SUPP
650.0000 mg | RECTAL | Status: AC
  Administered 2011-06-08: 650 mg via RECTAL

## 2011-06-08 MED ORDER — HEMOSTATIC AGENTS (NO CHARGE) OPTIME
TOPICAL | Status: DC | PRN
  Administered 2011-06-08: 3 via TOPICAL

## 2011-06-08 MED ORDER — MORPHINE SULFATE 4 MG/ML IJ SOLN
2.0000 mg | INTRAMUSCULAR | Status: DC | PRN
  Administered 2011-06-10: 4 mg via INTRAVENOUS
  Filled 2011-06-08: qty 1

## 2011-06-08 MED ORDER — NITROGLYCERIN IN D5W 200-5 MCG/ML-% IV SOLN: 0.0000 ug/min | INTRAVENOUS | Status: DC

## 2011-06-08 MED ORDER — PROTAMINE SULFATE 10 MG/ML IV SOLN
INTRAVENOUS | Status: DC | PRN
  Administered 2011-06-08: 10 mg via INTRAVENOUS
  Administered 2011-06-08: 270 mg via INTRAVENOUS

## 2011-06-08 MED ORDER — MIDAZOLAM HCL 2 MG/2ML IJ SOLN: 2.0000 mg | INTRAMUSCULAR | Status: DC | PRN

## 2011-06-08 MED ORDER — CHLORHEXIDINE GLUCONATE 4 % EX LIQD: 30.0000 mL | CUTANEOUS | Status: DC

## 2011-06-08 MED ORDER — ACETAMINOPHEN 500 MG PO TABS
1000.0000 mg | ORAL_TABLET | Freq: Four times a day (QID) | ORAL | Status: DC
  Administered 2011-06-08 – 2011-06-13 (×15): 1000 mg via ORAL
  Filled 2011-06-08: qty 2
  Filled 2011-06-08: qty 1
  Filled 2011-06-08 (×13): qty 2

## 2011-06-08 MED ORDER — ASPIRIN 81 MG PO CHEW: 324.0000 mg | CHEWABLE_TABLET | Freq: Every day | ORAL | Status: DC

## 2011-06-08 MED ORDER — DEXTROSE 5 % IV SOLN
1.5000 g | Freq: Two times a day (BID) | INTRAVENOUS | Status: AC
  Administered 2011-06-08 – 2011-06-10 (×4): 1.5 g via INTRAVENOUS
  Filled 2011-06-08 (×4): qty 1.5

## 2011-06-08 MED ORDER — ACETAMINOPHEN 160 MG/5ML PO SOLN: 975.0000 mg | Freq: Four times a day (QID) | ORAL | Status: DC

## 2011-06-08 MED ORDER — ASPIRIN EC 325 MG PO TBEC
325.0000 mg | DELAYED_RELEASE_TABLET | Freq: Every day | ORAL | Status: DC
  Administered 2011-06-09: 325 mg via ORAL
  Filled 2011-06-08 (×2): qty 1

## 2011-06-08 MED ORDER — SODIUM CHLORIDE 0.9 % IJ SOLN: 3.0000 mL | INTRAMUSCULAR | Status: DC | PRN

## 2011-06-08 MED ORDER — SODIUM CHLORIDE 0.9 % IJ SOLN
3.0000 mL | Freq: Two times a day (BID) | INTRAMUSCULAR | Status: DC
  Administered 2011-06-09: 3 mL via INTRAVENOUS
  Administered 2011-06-09: 22:00:00 via INTRAVENOUS
  Administered 2011-06-10: 3 mL via INTRAVENOUS

## 2011-06-08 MED ORDER — SODIUM CHLORIDE 0.9 % IV SOLN
INTRAVENOUS | Status: DC | PRN
  Administered 2011-06-08: 11:00:00 via INTRAVENOUS

## 2011-06-08 MED ORDER — LACTATED RINGERS IV SOLN: 500.0000 mL | Freq: Once | INTRAVENOUS | Status: AC | PRN

## 2011-06-08 MED ORDER — HEMOSTATIC AGENTS (NO CHARGE) OPTIME
TOPICAL | Status: DC | PRN
  Administered 2011-06-08: 1 via TOPICAL

## 2011-06-08 MED ORDER — PROPOFOL 10 MG/ML IV EMUL
INTRAVENOUS | Status: DC | PRN
  Administered 2011-06-08: 50 mg via INTRAVENOUS

## 2011-06-08 MED ORDER — METOPROLOL TARTRATE 25 MG/10 ML ORAL SUSPENSION
12.5000 mg | Freq: Two times a day (BID) | ORAL | Status: DC
  Administered 2011-06-11: 12.5 mg
  Filled 2011-06-08 (×9): qty 5

## 2011-06-08 MED ORDER — DEXTROSE 5 % IV SOLN
INTRAVENOUS | Status: DC | PRN
  Administered 2011-06-08 (×3): via INTRAVENOUS

## 2011-06-08 MED ORDER — MORPHINE SULFATE 2 MG/ML IJ SOLN: 1.0000 mg | INTRAMUSCULAR | Status: AC | PRN

## 2011-06-08 MED ORDER — VANCOMYCIN HCL 1000 MG IV SOLR
1000.0000 mg | Freq: Once | INTRAVENOUS | Status: DC
  Filled 2011-06-08: qty 1000

## 2011-06-08 MED ORDER — SODIUM CHLORIDE 0.9 % IV SOLN: 250.0000 mL | INTRAVENOUS | Status: DC

## 2011-06-08 MED ORDER — ACETAMINOPHEN 160 MG/5ML PO SOLN: 650.0000 mg | ORAL | Status: AC

## 2011-06-08 MED ORDER — INSULIN REGULAR BOLUS VIA INFUSION: 0.0000 [IU] | Freq: Three times a day (TID) | INTRAVENOUS | Status: DC

## 2011-06-08 MED ORDER — HETASTARCH-ELECTROLYTES 6 % IV SOLN
INTRAVENOUS | Status: DC | PRN
  Administered 2011-06-08: 11:00:00 via INTRAVENOUS

## 2011-06-08 MED ORDER — HEPARIN SODIUM (PORCINE) 1000 UNIT/ML IJ SOLN
INTRAMUSCULAR | Status: DC | PRN
  Administered 2011-06-08: 28000 [IU] via INTRAVENOUS

## 2011-06-08 MED ORDER — MAGNESIUM SULFATE 40 MG/ML IJ SOLN
4.0000 g | Freq: Once | INTRAMUSCULAR | Status: AC
  Administered 2011-06-08: 4 g via INTRAVENOUS
  Filled 2011-06-08: qty 100

## 2011-06-08 SURGICAL SUPPLY — 77 items
ADAPTER CARDIO PERF ANTE/RETRO (ADAPTER) ×2 IMPLANT
APPLICATOR COTTON TIP 6IN STRL (MISCELLANEOUS) IMPLANT
ATTRACTOMAT 16X20 MAGNETIC DRP (DRAPES) ×2 IMPLANT
BAG DECANTER FOR FLEXI CONT (MISCELLANEOUS) ×2 IMPLANT
BLADE SAW STERNAL (BLADE) ×2 IMPLANT
BLADE SURG 15 STRL LF DISP TIS (BLADE) ×2 IMPLANT
BLADE SURG 15 STRL SS (BLADE) ×2
CANISTER SUCTION 2500CC (MISCELLANEOUS) ×2 IMPLANT
CANNULA GUNDRY RCSP 15FR (MISCELLANEOUS) ×2 IMPLANT
CATH CPB KIT HENDRICKSON (MISCELLANEOUS) ×2 IMPLANT
CATH HEART VENT LEFT (CATHETERS) ×1 IMPLANT
CATH ROBINSON RED A/P 18FR (CATHETERS) ×4 IMPLANT
CATH THORACIC 36FR RT ANG (CATHETERS) ×4 IMPLANT
CLIP FOGARTY SPRING 6M (CLIP) IMPLANT
CLOTH BEACON ORANGE TIMEOUT ST (SAFETY) ×2 IMPLANT
CONT SPECI 4OZ STER CLIK (MISCELLANEOUS) ×2 IMPLANT
COVER SURGICAL LIGHT HANDLE (MISCELLANEOUS) ×4 IMPLANT
CRADLE DONUT ADULT HEAD (MISCELLANEOUS) ×2 IMPLANT
DRAIN CHANNEL 28F RND 3/8 FF (WOUND CARE) ×2 IMPLANT
DRAPE SLUSH MACHINE 52X66 (DRAPES) IMPLANT
DRAPE SLUSH/WARMER DISC (DRAPES) IMPLANT
DRSG COVADERM 4X14 (GAUZE/BANDAGES/DRESSINGS) ×2 IMPLANT
ELECT REM PT RETURN 9FT ADLT (ELECTROSURGICAL) ×4
ELECTRODE REM PT RTRN 9FT ADLT (ELECTROSURGICAL) ×2 IMPLANT
GLOVE BIO SURGEON STRL SZ 6 (GLOVE) ×6 IMPLANT
GLOVE BIO SURGEON STRL SZ 6.5 (GLOVE) ×6 IMPLANT
GLOVE BIOGEL PI IND STRL 7.0 (GLOVE) ×3 IMPLANT
GLOVE BIOGEL PI INDICATOR 7.0 (GLOVE) ×3
GLOVE EUDERMIC 7 POWDERFREE (GLOVE) ×6 IMPLANT
GOWN STRL NON-REIN LRG LVL3 (GOWN DISPOSABLE) ×8 IMPLANT
HEMOSTAT POWDER SURGIFOAM 1G (HEMOSTASIS) ×6 IMPLANT
HEMOSTAT SURGICEL 2X14 (HEMOSTASIS) ×2 IMPLANT
INSERT FOGARTY XLG (MISCELLANEOUS) ×2 IMPLANT
KIT BASIN OR (CUSTOM PROCEDURE TRAY) ×2 IMPLANT
KIT ROOM TURNOVER OR (KITS) ×2 IMPLANT
KIT SUCTION CATH 14FR (SUCTIONS) ×4 IMPLANT
LINE VENT (MISCELLANEOUS) ×2 IMPLANT
NS IRRIG 1000ML POUR BTL (IV SOLUTION) ×14 IMPLANT
PACK OPEN HEART (CUSTOM PROCEDURE TRAY) ×2 IMPLANT
PAD ARMBOARD 7.5X6 YLW CONV (MISCELLANEOUS) ×4 IMPLANT
SET CARDIOPLEGIA MPS 5001102 (MISCELLANEOUS) ×2 IMPLANT
SPONGE GAUZE 4X4 12PLY (GAUZE/BANDAGES/DRESSINGS) ×4 IMPLANT
SPONGE GAUZE 4X4 FOR O.R. (GAUZE/BANDAGES/DRESSINGS) ×2 IMPLANT
SUT BONE WAX W31G (SUTURE) ×2 IMPLANT
SUT ETHIBON 2 0 V 52N 30 (SUTURE) ×4 IMPLANT
SUT ETHIBON EXCEL 2-0 V-5 (SUTURE) IMPLANT
SUT ETHIBOND 2 0 SH (SUTURE) ×1
SUT ETHIBOND 2 0 SH 36X2 (SUTURE) ×1 IMPLANT
SUT ETHIBOND 2 0 V4 (SUTURE) IMPLANT
SUT ETHIBOND 2 0V4 GREEN (SUTURE) IMPLANT
SUT ETHIBOND 4 0 RB 1 (SUTURE) IMPLANT
SUT ETHIBOND V-5 VALVE (SUTURE) IMPLANT
SUT PROLENE 3 0 SH 1 (SUTURE) IMPLANT
SUT PROLENE 3 0 SH DA (SUTURE) ×6 IMPLANT
SUT PROLENE 4 0 RB 1 (SUTURE) ×4
SUT PROLENE 4 0 SH DA (SUTURE) ×2 IMPLANT
SUT PROLENE 4-0 RB1 .5 CRCL 36 (SUTURE) ×4 IMPLANT
SUT PROLENE 5 0 C 1 36 (SUTURE) ×4 IMPLANT
SUT SILK  1 MH (SUTURE) ×1
SUT SILK 1 MH (SUTURE) ×1 IMPLANT
SUT STEEL 6MS V (SUTURE) ×2 IMPLANT
SUT STEEL SZ 6 DBL 3X14 BALL (SUTURE) ×2 IMPLANT
SUT VIC AB 1 CTX 36 (SUTURE) ×2
SUT VIC AB 1 CTX36XBRD ANBCTR (SUTURE) ×2 IMPLANT
SUT VIC AB 2-0 CTX 27 (SUTURE) IMPLANT
SUT VIC AB 3-0 X1 27 (SUTURE) IMPLANT
SUTURE E-PAK OPEN HEART (SUTURE) ×2 IMPLANT
SYR 10ML KIT SKIN ADHESIVE (MISCELLANEOUS) IMPLANT
SYSTEM SAHARA CHEST DRAIN ATS (WOUND CARE) ×2 IMPLANT
TOWEL OR 17X24 6PK STRL BLUE (TOWEL DISPOSABLE) ×4 IMPLANT
TOWEL OR 17X26 10 PK STRL BLUE (TOWEL DISPOSABLE) ×4 IMPLANT
TRAY FOLEY IC TEMP SENS 14FR (CATHETERS) ×2 IMPLANT
TUBE FEEDING 8FR 16IN STR KANG (MISCELLANEOUS) ×2 IMPLANT
UNDERPAD 30X30 INCONTINENT (UNDERPADS AND DIAPERS) ×2 IMPLANT
VALVE MAGNA EASE AORTIC 25MM (Prosthesis & Implant Heart) ×2 IMPLANT
VENT LEFT HEART 12002 (CATHETERS) ×2
WATER STERILE IRR 1000ML POUR (IV SOLUTION) ×4 IMPLANT

## 2011-06-08 NOTE — Anesthesia Preprocedure Evaluation (Addendum)
Anesthesia Evaluation  Patient identified by MRN, date of birth, ID band Patient awake    Reviewed: Allergy & Precautions, H&P , NPO status , Patient's Chart, lab work & pertinent test results  Airway Mallampati: II TM Distance: >3 FB Neck ROM: Limited    Dental  (+) Teeth Intact,    Pulmonary neg pulmonary ROS,  clear to auscultation  Pulmonary exam normal       Cardiovascular Exercise Tolerance: Poor hypertension, Pt. on medications + Valvular Problems/Murmurs AS Regular Normal+ Systolic murmurs    Neuro/Psych Negative Neurological ROS     GI/Hepatic negative GI ROS, Neg liver ROS,   Endo/Other  Hypothyroidism   Renal/GU negative Renal ROS Bladder dysfunction   Hx BPH    Musculoskeletal  (+) Arthritis -,   Abdominal   Peds  Hematology negative hematology ROS (+)   Anesthesia Other Findings   Reproductive/Obstetrics negative OB ROS                         Anesthesia Physical Anesthesia Plan  ASA: III  Anesthesia Plan: General   Post-op Pain Management:    Induction: Intravenous  Airway Management Planned: Oral ETT  Additional Equipment: 3D TEE, PA Cath, CVP, Arterial line and Ultrasound Guidance Line Placement  Intra-op Plan:   Post-operative Plan: Post-operative intubation/ventilation  Informed Consent:   Dental advisory given  Plan Discussed with: CRNA  Anesthesia Plan Comments:         Anesthesia Quick Evaluation

## 2011-06-08 NOTE — Brief Op Note (Addendum)
                   [address]            [place] [address]          [phone]    06/08/2011  10:39 AM  PATIENT:  Shirl Harris  78 y.o. male  PRE-OPERATIVE DIAGNOSIS:  Aortic stenosis POST-OPERATIVE DIAGNOSIS:  Aortic stenosis  PROCEDURE:  Procedure(s): AORTIC VALVE REPLACEMENT (AVR)#25 Magna Ease pericardial(Model 3300 TFX, serial [id number])  SURGEON:  Surgeon(s): Loreli Slot, MD  PHYSICIAN ASSISTANT: Gershon Crane PA-C  ANESTHESIA:   general  PATIENT CONDITION:  ICU - intubated and hemodynamically stable.  PRE-OPERATIVE WEIGHT: 80kg  COMPLICATIONS: No Known  XC 71 min CPB 95 min  28 F Blake drain in mediastinum

## 2011-06-08 NOTE — Interval H&P Note (Signed)
History and Physical Interval Note:  06/08/2011 7:42 AM  Andre Evans  has presented today for surgery, with the diagnosis of AS  The various methods of treatment have been discussed with the patient and family. After consideration of risks, benefits and other options for treatment, the patient has consented to  Procedure(s): AORTIC VALVE REPLACEMENT (AVR) as a surgical intervention .  The patients' history has been reviewed, patient examined, no change in status, stable for surgery.  I have reviewed the patients' chart and labs.  Questions were answered to the patient's satisfaction.     HENDRICKSON,STEVEN C

## 2011-06-08 NOTE — Progress Notes (Signed)
Patient ID: Andre Evans, male   DOB: August 15, 1933, 78 y.o.   MRN: [id number]  Filed Vitals:   06/08/11 1823 06/08/11 1940 06/08/11 2000 06/08/11 2015  BP: 118/62  94/58 118/76  Pulse: 80 80 80 79  Temp: 98.4 F (36.9 C) 99 F (37.2 C) 99.1 F (37.3 C) 99 F (37.2 C)  TempSrc:      Resp: 12 12 10 15   Weight:      SpO2: 98% 97% 97% 97%   Weaning on ventilator  Urine output ok CT output low CBC    Component Value Date/Time   WBC 9.0 06/08/2011 1830   RBC 3.65* 06/08/2011 1830   HGB 10.2* 06/08/2011 1832   HCT 30.0* 06/08/2011 1832   PLT 86* 06/08/2011 1830   MCV 87.1 06/08/2011 1830   MCH 29.9 06/08/2011 1830   MCHC 34.3 06/08/2011 1830   RDW 13.3 06/08/2011 1830    BMET    Component Value Date/Time   NA 142 06/08/2011 1832   NA 140 04/30/2011 0901   K 4.1 06/08/2011 1832   CL 108 06/08/2011 1832   CO2 23 06/04/2011 1448   GLUCOSE 116* 06/08/2011 1832   GLUCOSE 89 04/30/2011 0901   BUN 18 06/08/2011 1832   BUN 24 04/30/2011 0901   CREATININE 0.90 06/08/2011 1832   CALCIUM 9.9 06/04/2011 1448   GFRNONAA 79* 06/08/2011 1830   GFRAA >90 06/08/2011 1830    A/P:  Stable.  Wean to extubate.

## 2011-06-08 NOTE — Progress Notes (Signed)
Nursing:  Patient very difficult to awaken from anesthesia.  As per report from OR anesthetist pt is very sensitive to anesthesia and required very little to sedate.  Spoke with family and they confirmed that after cardiac catheterization Andre Evans was very drowsy for several hours.  Bathed and will continue to stimulate patient.  At this time patient is unable to wean off full PRVC ventilator support.  Will continue to closely monitor.

## 2011-06-08 NOTE — Anesthesia Procedure Notes (Signed)
Procedure Name: Intubation Date/Time: 06/08/2011 8:07 AM Performed by: Tyrone Nine Pre-anesthesia Checklist: Patient identified, Emergency Drugs available, Suction available and Patient being monitored Patient Re-evaluated:Patient Re-evaluated prior to inductionOxygen Delivery Method: Circle System Utilized Preoxygenation: Pre-oxygenation with 100% oxygen Intubation Type: IV induction Ventilation: Oral airway inserted - appropriate to patient size and Two handed mask ventilation required Laryngoscope Size: Mac and 3 Grade View: Grade II Tube type: Oral Tube size: 8.0 mm Number of attempts: 1 Airway Equipment and Method: stylet Placement Confirmation: ETT inserted through vocal cords under direct vision,  positive ETCO2,  CO2 detector and breath sounds checked- equal and bilateral Secured at: 22 cm Dental Injury: Teeth and Oropharynx as per pre-operative assessment

## 2011-06-08 NOTE — Preoperative (Signed)
Beta Blockers   Reason not to administer Beta Blockers:Not Applicable 

## 2011-06-08 NOTE — Transfer of Care (Signed)
Immediate Anesthesia Transfer of Care Note  Patient: Andre Evans  Procedure(s) Performed:  AORTIC VALVE REPLACEMENT (AVR)  Patient Location: PACU and SICU  Anesthesia Type: General  Level of Consciousness: unresponsive  Airway & Oxygen Therapy: Patient remains intubated per anesthesia plan and Patient placed on Ventilator (see vital sign flow sheet for setting)  Post-op Assessment: Report given to PACU RN and Post -op Vital signs reviewed and stable  Post vital signs: Reviewed and stable  Complications: No apparent anesthesia complications

## 2011-06-08 NOTE — H&P (Signed)
PCP is No primary provider on file. Referring Provider is Gollan, Timothy J, MD  Chief Complaint  Patient presents with  . Aortic Stenosis    eval and treat---ECHO,TEE, HEART CATH    HPI: 78-year-old gentleman with a history of heart murmur. Has been followed for this for several years. Recently been experiencing lightheadedness and dizziness with exertion he noted this particular after he had carried some heavy objects. He is also noted some shortness of breath with exertion recently which is new as well. He saw Dr. Hedrick and Dr. Gollan. An echocardiogram revealed progression of his aortic stenosis. TTE suggested the valve area was less than 1 cm. TEE showed a valve area of 0.7 cm. There was no other significant valvular pathology. Left ventricular function was preserved. He had cardiac catheterization done by Dr. Gollan, which showed no hemodynamically significant coronary artery disease, there was some mild plaquing. Interestingly, the patient says that over the past week or so he has not had any additional symptoms.   Past Medical History  Diagnosis Date  . HTN (hypertension)     unspecified  . HLD (hyperlipidemia)     mixed  . Hemorrhoids   . HTN (hypertension)   . Hypothyroidism   . History of cardiac murmur     with a leaky valve  . BPH (benign prostatic hyperplasia)     No past surgical history on file.  Family History  Problem Relation Age of Onset  . Coronary artery disease      family hx  . Diabetes      family hx  . Heart failure      family hx - CHF  . Diabetes Father   . Hypertension Mother     Social History History  Substance Use Topics  . Smoking status: Never Smoker   . Smokeless tobacco: Not on file   Comment: tobacco use - no  . Alcohol Use: No    Current Outpatient Prescriptions  Medication Sig Dispense Refill  . Alfalfa TABS Take by mouth.        . Calcium-Magnesium-Zinc 1000-400-15 MG TABS Take by mouth.        . Chlorophyll POWD by Does  not apply route.        . co-enzyme Q-10 30 MG capsule Take 30 mg by mouth 3 (three) times daily.        . fish oil-omega-3 fatty acids 1000 MG capsule Take 1 g by mouth daily.        . Multiple Vitamins-Minerals (ANTIOXIDANTS PROTECTOR) TABS Take by mouth.        . RED YEAST RICE EXTRACT PO Take by mouth.        . lisinopril (PRINIVIL,ZESTRIL) 20 MG tablet Take 1 tablet (20 mg total) by mouth 2 (two) times daily.  60 tablet  11    No Known Allergies  Review of Systems  Constitutional: Negative.   HENT: Negative.   Eyes: Negative.   Respiratory: Positive for shortness of breath (with exertion). Negative for cough, chest tightness, wheezing and stridor.   Cardiovascular: Negative for chest pain, palpitations and leg swelling.       Heart murmur SOB with exertion   Gastrointestinal:       Constipation  Genitourinary:       Enlarged prostate  Neurological: Positive for dizziness and light-headedness. Negative for syncope.  All other systems reviewed and are negative.    BP 132/82  Pulse 70  Resp 16  Ht 5' 6" (1.676   m)  Wt 175 lb (79.379 kg)  BMI 28.25 kg/m2  SpO2 94% Physical Exam  Vitals reviewed. Constitutional: He is oriented to person, place, and time. He appears well-developed and well-nourished. No distress.  HENT:  Head: Normocephalic and atraumatic.  Eyes: EOM are normal. Pupils are equal, round, and reactive to light.  Neck: Neck supple. No tracheal deviation present. No thyromegaly present.       Bilateral bruits v. Transmitted murmur  Cardiovascular: Normal rate and regular rhythm.   Murmur (3/6 c/d high-pitched murmur RUSB> remainder of precordium) heard. Pulmonary/Chest: Effort normal and breath sounds normal. He has no wheezes. He has no rales.  Abdominal: Soft. Bowel sounds are normal.  Musculoskeletal: Normal range of motion. He exhibits no edema.  Lymphadenopathy:    He has no cervical adenopathy.  Neurological: He is alert and oriented to person,  place, and time.       No focal deficits  Skin: Skin is warm and dry.  Psychiatric: He has a normal mood and affect.     Diagnostic Tests: TEE and catheterization reviewed, findings as previously noted.  Impression: 78-year-old gentleman with severe aortic stenosis which is now symptomatic. His valve area is 0.7 cm by TEE. Aortic valve replacement is indicated for survival benefit and relief of symptoms. Given his age tissue valve would be the preferred alternative. I did discuss with he and his family the relative advantages and disadvantages of tissue and mechanical valves. He strongly wishes to avoid Coumadin and strongly favors tissue valve.  I have discussed with the patient and his family the general nature of the procedure, need for general anesthesia,and incisions to be used. We did discuss the option of a regular full sternotomy versus a more minimally invasive approach, such as a partial sternotomy or parasternal incision. He favors a conventional approach. I have discussed the expected hospital stay, overall recovery and short and long term outcomes. They understand there is an excellent chance of procedural success They understand the risks include but are not limited to death, stroke, MI, DVT/PE, bleeding, possible need for transfusion, infections,other organ system dysfunction including respiratory, renal, or GI complications. They also understand there is approximately 1% chance of complete heart block requiring permanent pacemaker placement. He understands and accepts these risks and agrees to proceed.   Plan: Aortic valve replacement on Tuesday, January 8. Patient prefers a tissue valve. 

## 2011-06-08 NOTE — Anesthesia Postprocedure Evaluation (Signed)
  Anesthesia Post-op Note  Patient: Andre Evans  Procedure(s) Performed:  AORTIC VALVE REPLACEMENT (AVR)  Patient Location: SICU  Anesthesia Type: General  Level of Consciousness: unresponsive  Airway and Oxygen Therapy: Patient remains intubated per anesthesia plan  Post-op Pain: none  Post-op Assessment: Post-op Vital signs reviewed, Patient's Cardiovascular Status Stable and Respiratory Function Stable  Post-op Vital Signs: Reviewed and stable  Complications: No apparent anesthesia complications

## 2011-06-08 NOTE — Progress Notes (Addendum)
  Echocardiogram Echocardiogram Transesophageal has been performed.  Andre Evans 06/08/2011, 9:29 AM

## 2011-06-08 NOTE — Procedures (Signed)
Extubation Procedure Note  Patient Details:   Name: Andre Evans DOB: [birth year] MRN: [id number]   Airway Documentation:  Patient extubated to 4 lpm cannula at 2140.  VC 750 ml, NIF -35, able to hold head off bed 15 seconds.  Tolerated procedure well, able to vocalize after.  Evaluation  O2 sats: stable throughout Complications: No apparent complications Patient did tolerate procedure well. Bilateral Breath Sounds: Clear;Diminished   Yes  Ellenora Talton, Aloha Gell 06/08/2011, 9:44 PM

## 2011-06-08 NOTE — OR Nursing (Signed)
11:30am 2nd call to SICU

## 2011-06-08 NOTE — H&P (Signed)
Patient's incentive spirometer and booklets came with patient to OR (labeled bag with patient sticker) - sent with patient to units 2300

## 2011-06-09 ENCOUNTER — Inpatient Hospital Stay (HOSPITAL_COMMUNITY): Payer: Medicare Other

## 2011-06-09 ENCOUNTER — Encounter (HOSPITAL_COMMUNITY): Payer: Self-pay | Admitting: Thoracic Surgery (Cardiothoracic Vascular Surgery)

## 2011-06-09 LAB — BASIC METABOLIC PANEL
BUN: 23 mg/dL (ref 6–23)
Chloride: 110 mEq/L (ref 96–112)
GFR calc Af Amer: 76 mL/min — ABNORMAL LOW (ref >90)
Glucose, Bld: 99 mg/dL (ref 70–99)
Potassium: 3.9 mEq/L (ref 3.5–5.1)

## 2011-06-09 LAB — GLUCOSE, CAPILLARY
Glucose-Capillary: 118 mg/dL — ABNORMAL HIGH (ref 70–99)
Glucose-Capillary: 132 mg/dL — ABNORMAL HIGH (ref 70–99)
Glucose-Capillary: 70 mg/dL (ref 70–99)
Glucose-Capillary: 105 mg/dL — ABNORMAL HIGH (ref 70–99)
Glucose-Capillary: 141 mg/dL — ABNORMAL HIGH (ref 70–99)
Glucose-Capillary: 176 mg/dL — ABNORMAL HIGH (ref 70–99)
Glucose-Capillary: 183 mg/dL — ABNORMAL HIGH (ref 70–99)

## 2011-06-09 LAB — POCT I-STAT, CHEM 8
BUN: 26 mg/dL — ABNORMAL HIGH (ref 6–23)
Chloride: 105 mEq/L (ref 96–112)
HCT: 34 % — ABNORMAL LOW (ref 39.0–52.0)
Sodium: 138 mEq/L (ref 135–145)
TCO2: 22 mmol/L (ref 0–100)

## 2011-06-09 LAB — CBC
HCT: 32.4 % — ABNORMAL LOW (ref 39.0–52.0)
Hemoglobin: 10.7 g/dL — ABNORMAL LOW (ref 13.0–17.0)
Hemoglobin: 11.4 g/dL — ABNORMAL LOW (ref 13.0–17.0)
MCH: 29.5 pg (ref 26.0–34.0)
MCV: 88 fL (ref 78.0–100.0)
MCV: 89.4 fL (ref 78.0–100.0)
Platelets: 91 10*3/uL — ABNORMAL LOW (ref 150–400)
RBC: 3.68 MIL/uL — ABNORMAL LOW (ref 4.22–5.81)
RBC: 3.87 MIL/uL — ABNORMAL LOW (ref 4.22–5.81)
WBC: 10.1 10*3/uL (ref 4.0–10.5)

## 2011-06-09 LAB — CREATININE, SERUM: GFR calc Af Amer: 66 mL/min — ABNORMAL LOW (ref >90)

## 2011-06-09 MED ORDER — INSULIN ASPART 100 UNIT/ML ~~LOC~~ SOLN
0.0000 [IU] | SUBCUTANEOUS | Status: DC
  Administered 2011-06-09 (×3): 2 [IU] via SUBCUTANEOUS
  Administered 2011-06-09 (×2): 4 [IU] via SUBCUTANEOUS
  Administered 2011-06-10 (×5): 2 [IU] via SUBCUTANEOUS

## 2011-06-09 MED ORDER — INSULIN ASPART 100 UNIT/ML ~~LOC~~ SOLN: 0.0000 [IU] | SUBCUTANEOUS | Status: DC

## 2011-06-09 MED ORDER — POTASSIUM CHLORIDE 10 MEQ/100ML IV SOLN
10.0000 meq | INTRAVENOUS | Status: AC
  Administered 2011-06-09 (×4): 10 meq via INTRAVENOUS

## 2011-06-09 MED ORDER — FUROSEMIDE 10 MG/ML IJ SOLN
40.0000 mg | Freq: Once | INTRAMUSCULAR | Status: AC
  Administered 2011-06-09: 40 mg via INTRAVENOUS
  Filled 2011-06-09: qty 4

## 2011-06-09 MED ORDER — INSULIN GLARGINE 100 UNIT/ML ~~LOC~~ SOLN
20.0000 [IU] | Freq: Every day | SUBCUTANEOUS | Status: DC
  Administered 2011-06-09 – 2011-06-10 (×2): 20 [IU] via SUBCUTANEOUS
  Filled 2011-06-09: qty 3

## 2011-06-09 MED ORDER — INSULIN ASPART 100 UNIT/ML ~~LOC~~ SOLN
0.0000 [IU] | SUBCUTANEOUS | Status: DC
  Filled 2011-06-09: qty 3

## 2011-06-09 MED ORDER — INSULIN ASPART 100 UNIT/ML ~~LOC~~ SOLN
0.0000 [IU] | SUBCUTANEOUS | Status: DC
  Administered 2011-06-09: 2 [IU] via SUBCUTANEOUS

## 2011-06-09 NOTE — Progress Notes (Signed)
UR Completed.  Andre Evans [phone] 06/09/2011     

## 2011-06-09 NOTE — Progress Notes (Signed)
1 Day Post-Op Procedure(s) (LRB): AORTIC VALVE REPLACEMENT (AVR) (N/A) Subjective: "Hurts when I breathe"  Objective: Vital signs in last 24 hours: Temp:  [95 F (35 Evans)-99.5 F (37.5 Evans)] 99 F (37.2 Evans) (01/09 0730) Pulse Rate:  [79-91] 79  (01/09 0730) Cardiac Rhythm:  [-] Atrial paced (01/09 0700) Resp:  [10-23] 23  (01/09 0730) BP: (88-137)/(52-79) 92/52 mmHg (01/09 0000) SpO2:  [93 %-99 %] 94 % (01/09 0730) FiO2 (%):  [2 %-50 %] 2 % (01/09 0600) Weight:  [83.1 kg (183 lb 3.2 oz)] 83.1 kg (183 lb 3.2 oz) (01/09 0500)  Hemodynamic parameters for last 24 hours: PAP: (22-37)/(11-21) 37/21 mmHg CO:  [3.5 L/min-6.1 L/min] 6.1 L/min CI:  [1.4 L/min/m2-2.8 L/min/m2] 2.4 L/min/m2  Intake/Output from previous day: 01/08 0701 - 01/09 0700 In: 7707 [I.V.:4992; Blood:955; NG/GT:60; IV Piggyback:1700] Out: 4100 [Urine:2040; Emesis/NG output:100; Blood:1705; Chest Tube:255] Intake/Output this shift:    General appearance: alert and no distress Neurologic: intact Heart: regular rate and rhythm and friction rub heard loud Lungs: diminished breath sounds bibasilar Abdomen: soft, NT, minimal BS  Lab Results:  Basename 06/09/11 0444 06/08/11 1832 06/08/11 1830  WBC 10.1 -- 9.0  HGB 10.7* 10.2* --  HCT 32.4* 30.0* --  PLT 91* -- 86*   BMET:  Basename 06/09/11 0444 06/08/11 1832  NA 141 142  K 3.9 4.1  CL 110 108  CO2 23 --  GLUCOSE 99 116*  BUN 23 18  CREATININE 1.07 0.90  CALCIUM 8.0* --    PT/INR:  Basename 06/08/11 1206  LABPROT 21.4*  INR 1.82*   ABG    Component Value Date/Time   PHART 7.352 06/08/2011 2355   HCO3 21.3 06/08/2011 2355   TCO2 22 06/08/2011 2355   ACIDBASEDEF 4.0* 06/08/2011 2355   O2SAT 95.0 06/08/2011 2355   CBG (last 3)   Basename 06/09/11 0630 06/09/11 0546 06/09/11 0420  GLUCAP 128* 102* 70    Assessment/Plan: S/P Procedure(s) (LRB): AORTIC VALVE REPLACEMENT (AVR) (N/A) Mobilize Diuresis d/Evans tubes/lines CV- POD #1 AVR, pericardial, good  hemodynamics- d/Evans swan Resp: mild atelectasis, pulmonary hygiene Renal: lytes, cr OK, diurese Thrombocytopenia- slowly rising, follow Anemia secondary to ABL- follow    LOS: 1 day    Andre Evans 06/09/2011

## 2011-06-09 NOTE — Progress Notes (Signed)
Over riding KCL orders d/t wrong ml selected, giving 50 cc bags

## 2011-06-09 NOTE — Op Note (Signed)
[name], Andre Evans  MEDICAL RECORD NO.:  [id number]  LOCATION:  2310                         FACILITY:  [facility]  PHYSICIAN:  Salvatore Decent. Dorris Fetch, M.D.DATE OF BIRTH:  1933-09-03  DATE OF PROCEDURE:  06/08/2011 DATE OF DISCHARGE:                              OPERATIVE REPORT   PREOPERATIVE DIAGNOSIS:  Severe aortic stenosis.  POSTOPERATIVE DIAGNOSIS:  Severe aortic stenosis.  PROCEDURES:  Median sternotomy, extracorporeal circulation, aortic valve replacement with 25-mm [facility] Ease pericardial valve (model #[id number], serial [id number]).  SURGEON:  Salvatore Decent. Dorris Fetch, M.D.  ASSISTANT:  Rowe Clack, P.A.-C.  ANESTHESIA:  General.  FINDINGS:  Transesophageal echocardiography revealed a tricuspid calcific stenotic aortic valve with severe aortic stenosis, heavy calcification of the leaflets.  Trace mitral regurgitation, left ventricular hypertrophy, preserved left ventricular systolic function. Intraop tricuspid calcific stenotic aortic valve with heavy leaflet calcification, moderate annular calcification.  Post-bypass TEE showed preserved left ventricular function.  Good function of the prosthetic valve with no perivalvular leaks.  CLINICAL NOTE:  Andre Evans is a 78 year old gentleman with a known heart murmur, has been experiencing lightheadedness and dizziness with exertion.  A TEE showed a valve area of 0.7 cm2.  He underwent cardiac catheterization which showed no hemodynamically significant coronary artery disease.  He was referred for aortic valve replacement.  I have discussed in detail with the patient the indications, risks, benefits, and alternatives.  He understood the risks as outlined in the initial consultation note, accepted them and agreed to proceed.  We did discuss valve options including mechanical and tissue valves.  Given his age and desire to avoid permanent anticoagulation with Coumadin, pericardial valve  was the optimal choice in his case.  OPERATIVE NOTE:  Andre Evans was brought to the preop holding area on June 08, 2011, there the Anesthesia Service under direction of W. Autumn Patty, placed a Swan-Ganz catheter and arterial blood pressure monitoring line.  Intravenous antibiotics were administered. He was taken to the operating room, anesthetized and intubated.  A Foley catheter was placed.  The chest, abdomen, and legs were prepped and draped in usual fashion.  A median sternotomy was performed.  Initial hemostasis was achieved. The retractor was placed.  The patient was fully heparinized. Anticoagulation was monitored with ACT measurement.  The pericardium was opened.  The ascending aorta was inspected.  There was no evidence of atherosclerotic disease in the aorta itself.  After confirming adequate anticoagulation with ACT measurement, the aorta was cannulated via concentric 2-0 Ethibond pledgeted pursestring sutures.  A dual-stage venous cannula was placed via pursestring suture in the right atrial appendage.  Cardiopulmonary bypass was instituted and the patient was cooled to 32 degrees Celsius.  Flows were maintained per protocol throughout bypass.  The left ventricular vent was placed via pursestring suture in the right superior pulmonary vein.  The retrograde cardioplegic cannula placed via pursestring suture in the right atrium and directed in the coronary sinus.  An antegrade cardioplegic cannula was placed in the ascending aorta.  The aorta was crossclamped.  The left ventricle was emptied via the vents.  Cardiac arrest then was achieved with combination of cold antegrade and retrograde  blood cardioplegia and topical iced saline. Initial 750 mL of cardioplegia was administered antegrade.  There was a rapid diastolic arrest.  An additional 750 mL of cardioplegia was administered retrograde.  There was myocardial septal cooling to 10 degrees Celsius.  An  aortotomy was made.  The aortic valve was inspected to confirm that the valve was tricuspid valve with normal commissure and coronary anatomy.  The leaflets were heavily calcified.  The valve leaflets were excised.  Care was taken to contain all calcific debris.  There was moderate annular calcification.  This was debrided with rongeur.  Again, care was taken in regards to the calcific debris.  After debriding the anulus, it was copiously irrigated with iced saline and sized for a 25- mm [employer] pericardial valve.  The valve was prepared per manufacturer's recommendations.  Additional cardioplegia was administered at 15-minute intervals during the crossclamp portion of the procedure.  A 2-0 Ethibond horizontal mattress sutures with subannular pledgets were placed circumferentially around the annulus, 19 sutures were used.  After the valve had been prepared, the sutures were placed through the sewing ring of the valve which was lowered into place, it seated nicely.  The sutures then were sequentially tied.  After the valve had been placed, all the sutures were cut.  The annulus then was probed with a fine tip right-angle clamp to make sure that there were no gaps.  The coronary ostia were inspected.  There was no impingement on the coronary ostia. Rewarming was begun.  The aortotomy then was closed in 2 layers with running 4-0 Prolene suture, first was a running horizontal mattress suture followed by running 4-0 Prolene simple suture.  After closing the aortotomy, the patient was placed in Trendelenburg position, a warm dose of retrograde cardioplegia was administered.  Extensive de-airing maneuvers were performed.  The balloon was deflated on the retrograde cannula.  After de-airing was complete, the aortic crossclamp was removed.  The total crossclamp time was 71 minutes.  The patient initially fibrillated, required single defibrillation with 10 joules and then was in  sinus bradycardia thereafter.  While rewarming was completed, the aortotomy was inspected for hemostasis.  The retrograde cardioplegic cannula was removed.  The left ventricular vent was removed once the heart was beating on a regular basis.  Additional de-airing was performed with an 18-gauge Angiocath via the left ventricular apex.  When the patient rewarmed to a core temperature of 37 degrees Celsius, he was weaned from cardiopulmonary bypass on the first attempt without inotropic support.  He was DDD paced at the time of separation from bypass.  When first starting to come off bypass, additional air was noted within the left ventricle, this was evacuated with an Angiocath through the apex.  Then, no significant residual air noted at the time of separation from bypass.  Test dose protamine was administered and was well tolerated.  The atrial and aortic cannulae were removed, as was the aortic root vent.  The remainder of the protamine was administered without incident. Hemostasis was achieved.  Chest was irrigated with 1 L of warm normal saline.  The pericardium was reapproximated with interrupted 3-0 silk sutures.  It came together easily without tension.  A 28-French Blake drain was placed through a separate stab incision and secured with a #1 silk suture.  The sternum was closed with a combination of single and double heavy gauge stainless steel wires.  The pectoralis fascia, subcutaneous tissue, and skin were closed in standard fashion.  All sponge, needle, instrument counts were correct at the end of the procedure.  The patient was taken from the operating room to the surgical intensive care unit, intubated and hemodynamically stable.     Salvatore Decent Dorris Fetch, M.D.     SCH/MEDQ  D:  06/08/2011  T:  06/09/2011  Job:  [id number]

## 2011-06-09 NOTE — Progress Notes (Signed)
Patient ID: Andre Evans, male   DOB: 01/07/1934, 78 y.o.   MRN: [id number] BP 113/67  Pulse 79  Temp(Src) 98.6 F (37 C) (Oral)  Resp 21  Wt 183 lb 3.2 oz (83.1 kg)  SpO2 94%  Stable day A paced To circle in am  Delight Ovens MD  Beeper [phone] Office [phone]

## 2011-06-10 ENCOUNTER — Encounter (HOSPITAL_COMMUNITY): Payer: Self-pay | Admitting: General Practice

## 2011-06-10 ENCOUNTER — Inpatient Hospital Stay (HOSPITAL_COMMUNITY): Payer: Medicare Other

## 2011-06-10 LAB — GLUCOSE, CAPILLARY
Glucose-Capillary: 134 mg/dL — ABNORMAL HIGH (ref 70–99)
Glucose-Capillary: 140 mg/dL — ABNORMAL HIGH (ref 70–99)
Glucose-Capillary: 152 mg/dL — ABNORMAL HIGH (ref 70–99)

## 2011-06-10 LAB — BASIC METABOLIC PANEL
GFR calc non Af Amer: 67 mL/min — ABNORMAL LOW (ref >90)
Glucose, Bld: 138 mg/dL — ABNORMAL HIGH (ref 70–99)
Potassium: 4.3 mEq/L (ref 3.5–5.1)
Sodium: 131 mEq/L — ABNORMAL LOW (ref 135–145)

## 2011-06-10 LAB — CBC
Hemoglobin: 10.8 g/dL — ABNORMAL LOW (ref 13.0–17.0)
MCH: 29.1 pg (ref 26.0–34.0)
RBC: 3.71 MIL/uL — ABNORMAL LOW (ref 4.22–5.81)
WBC: 11.1 10*3/uL — ABNORMAL HIGH (ref 4.0–10.5)

## 2011-06-10 MED ORDER — POVIDONE-IODINE 7.5 % EX SOLN
1.0000 "application " | Freq: Two times a day (BID) | CUTANEOUS | Status: DC
  Administered 2011-06-10 – 2011-06-13 (×6): 1 via TOPICAL
  Filled 2011-06-10: qty 15

## 2011-06-10 MED ORDER — OMEGA-3-ACID ETHYL ESTERS 1 G PO CAPS
1.0000 g | ORAL_CAPSULE | Freq: Every day | ORAL | Status: DC
  Administered 2011-06-10 – 2011-06-13 (×4): 1 g via ORAL
  Filled 2011-06-10 (×4): qty 1

## 2011-06-10 MED ORDER — TRAMADOL HCL 50 MG PO TABS: 50.0000 mg | ORAL_TABLET | ORAL | Status: DC | PRN

## 2011-06-10 MED ORDER — ZOLPIDEM TARTRATE 5 MG PO TABS: 5.0000 mg | ORAL_TABLET | Freq: Every evening | ORAL | Status: DC | PRN

## 2011-06-10 MED ORDER — SODIUM CHLORIDE 0.9 % IJ SOLN
3.0000 mL | Freq: Two times a day (BID) | INTRAMUSCULAR | Status: DC
  Administered 2011-06-10 – 2011-06-12 (×5): 3 mL via INTRAVENOUS

## 2011-06-10 MED ORDER — OMEGA-3 FATTY ACIDS 1000 MG PO CAPS: 1.0000 g | ORAL_CAPSULE | Freq: Every day | ORAL | Status: DC

## 2011-06-10 MED ORDER — SODIUM CHLORIDE 0.9 % IV SOLN: 250.0000 mL | INTRAVENOUS | Status: DC | PRN

## 2011-06-10 MED ORDER — MOVING RIGHT ALONG BOOK
Freq: Once | Status: DC
  Filled 2011-06-10: qty 1

## 2011-06-10 MED ORDER — ASPIRIN EC 81 MG PO TBEC
81.0000 mg | DELAYED_RELEASE_TABLET | Freq: Every day | ORAL | Status: DC
  Administered 2011-06-10 – 2011-06-13 (×3): 81 mg via ORAL
  Filled 2011-06-10 (×4): qty 1

## 2011-06-10 MED ORDER — MAGNESIUM HYDROXIDE 400 MG/5ML PO SUSP: 30.0000 mL | Freq: Every day | ORAL | Status: DC | PRN

## 2011-06-10 MED ORDER — FUROSEMIDE 40 MG PO TABS
40.0000 mg | ORAL_TABLET | Freq: Every day | ORAL | Status: AC
  Administered 2011-06-10 – 2011-06-12 (×3): 40 mg via ORAL
  Filled 2011-06-10 (×3): qty 1

## 2011-06-10 MED ORDER — SODIUM CHLORIDE 0.9 % IJ SOLN: 3.0000 mL | INTRAMUSCULAR | Status: DC | PRN

## 2011-06-10 MED ORDER — WARFARIN SODIUM 5 MG PO TABS
5.0000 mg | ORAL_TABLET | Freq: Once | ORAL | Status: AC
  Administered 2011-06-10: 5 mg via ORAL
  Filled 2011-06-10: qty 1

## 2011-06-10 MED ORDER — ALUM & MAG HYDROXIDE-SIMETH 200-200-20 MG/5ML PO SUSP: 15.0000 mL | ORAL | Status: DC | PRN

## 2011-06-10 MED ORDER — LISINOPRIL 20 MG PO TABS
20.0000 mg | ORAL_TABLET | Freq: Every day | ORAL | Status: DC
  Administered 2011-06-11 – 2011-06-12 (×2): 20 mg via ORAL
  Filled 2011-06-10 (×3): qty 1

## 2011-06-10 MED ORDER — POTASSIUM CHLORIDE CRYS ER 20 MEQ PO TBCR
20.0000 meq | EXTENDED_RELEASE_TABLET | Freq: Two times a day (BID) | ORAL | Status: AC
  Administered 2011-06-10 – 2011-06-12 (×6): 20 meq via ORAL
  Filled 2011-06-10 (×6): qty 1

## 2011-06-10 MED ORDER — ASPIRIN 81 MG PO CHEW
81.0000 mg | CHEWABLE_TABLET | Freq: Every day | ORAL | Status: DC
  Administered 2011-06-11: 81 mg

## 2011-06-10 MED FILL — Sodium Bicarbonate IV Soln 8.4%: INTRAVENOUS | Qty: 50 | Status: AC

## 2011-06-10 MED FILL — Sodium Chloride Irrigation Soln 0.9%: Qty: 3000 | Status: AC

## 2011-06-10 MED FILL — Heparin Sodium (Porcine) Inj 1000 Unit/ML: INTRAMUSCULAR | Qty: 120 | Status: CN

## 2011-06-10 MED FILL — Electrolyte-R (PH 7.4) Solution: INTRAVENOUS | Qty: 3000 | Status: AC

## 2011-06-10 MED FILL — Insulin Regular (Human) Inj 100 Unit/ML: INTRAMUSCULAR | Qty: 1 | Status: AC

## 2011-06-10 MED FILL — Potassium Chloride Inj 2 mEq/ML: INTRAVENOUS | Qty: 40 | Status: AC

## 2011-06-10 MED FILL — Sodium Chloride IV Soln 0.9%: INTRAVENOUS | Qty: 1000 | Status: AC

## 2011-06-10 MED FILL — Mannitol IV Soln 20%: INTRAVENOUS | Qty: 500 | Status: AC

## 2011-06-10 MED FILL — Magnesium Sulfate Inj 50%: INTRAMUSCULAR | Qty: 10 | Status: AC

## 2011-06-10 NOTE — Progress Notes (Signed)
2 Days Post-Op Procedure(s) (LRB): AORTIC VALVE REPLACEMENT (AVR) (N/A) Subjective: C/o burning sensation penis- from foley catheter  Objective: Vital signs in last 24 hours: Temp:  [97.8 F (36.6 C)-98.8 F (37.1 C)] 98.8 F (37.1 C) (01/10 0733) Pulse Rate:  [78-89] 79  (01/10 0700) Cardiac Rhythm:  [-] Atrial paced (01/10 0600) Resp:  [15-28] 16  (01/10 0700) BP: (95-147)/(50-103) 109/60 mmHg (01/10 0700) SpO2:  [90 %-96 %] 94 % (01/10 0700) Weight:  [83.3 kg (183 lb 10.3 oz)] 83.3 kg (183 lb 10.3 oz) (01/10 0500)  Hemodynamic parameters for last 24 hours:    Intake/Output from previous day: 01/09 0701 - 01/10 0700 In: 1974 [P.O.:1260; I.V.:410; IV Piggyback:304] Out: 1070 [Urine:1070] Intake/Output this shift:    General appearance: alert and no distress Neurologic: intact Heart: regular rate and rhythm Lungs: diminished breath sounds base - bibasilar L > R Abdomen: normal findings: soft, non-tender Wound: clean and dry  Lab Results:  Basename 06/10/11 0400 06/09/11 1619 06/09/11 1610  WBC 11.1* -- 12.1*  HGB 10.8* 11.6* --  HCT 32.8* 34.0* --  PLT 95* -- 105*   BMET:  Basename 06/10/11 0400 06/09/11 1619 06/09/11 0444  NA 131* 138 --  K 4.3 4.6 --  CL 100 105 --  CO2 24 -- 23  GLUCOSE 138* 159* --  BUN 30* 26* --  CREATININE 1.05 1.10 --  CALCIUM 8.3* -- 8.0*    PT/INR:  Basename 06/08/11 1206  LABPROT 21.4*  INR 1.82*   ABG    Component Value Date/Time   PHART 7.352 06/08/2011 2355   HCO3 21.3 06/08/2011 2355   TCO2 22 06/09/2011 1619   ACIDBASEDEF 4.0* 06/08/2011 2355   O2SAT 95.0 06/08/2011 2355   CBG (last 3)   Basename 06/10/11 0732 06/10/11 0315 06/10/11 0019  GLUCAP 152* 131* 140*    Assessment/Plan: S/P Procedure(s) (LRB): AORTIC VALVE REPLACEMENT (AVR) (N/A) Plan for transfer to step-down: see transfer orders CV stable Pericardial AVR- will plan for 6 weeks of coumadin- start today Diurese Ambulate    LOS: 2 days     Nou Chard C 06/10/2011

## 2011-06-10 NOTE — Progress Notes (Signed)
Nursing: Patient able to void post foley catheter removal.  Has no difficulty voiding but complains of urinary incontinence which is not normal for him.  Patient states he is "leaking" from his penis at times.  Have started scheduled bladder training with him and instructed him concerning Kegel exercises for bladder control.

## 2011-06-11 ENCOUNTER — Inpatient Hospital Stay (HOSPITAL_COMMUNITY): Payer: Medicare Other

## 2011-06-11 LAB — URINE CULTURE
Colony Count: NO GROWTH
Culture  Setup Time: 201301111910
Culture: NO GROWTH

## 2011-06-11 LAB — URINALYSIS, ROUTINE W REFLEX MICROSCOPIC
Bilirubin Urine: NEGATIVE
Glucose, UA: NEGATIVE mg/dL
Specific Gravity, Urine: 1.018 (ref 1.005–1.030)
Urobilinogen, UA: 0.2 mg/dL (ref 0.0–1.0)

## 2011-06-11 LAB — CBC
MCH: 29.9 pg (ref 26.0–34.0)
MCHC: 33.8 g/dL (ref 30.0–36.0)
Platelets: 99 10*3/uL — ABNORMAL LOW (ref 150–400)

## 2011-06-11 LAB — BASIC METABOLIC PANEL
Calcium: 8.7 mg/dL (ref 8.4–10.5)
GFR calc non Af Amer: 79 mL/min — ABNORMAL LOW (ref >90)
Sodium: 135 mEq/L (ref 135–145)

## 2011-06-11 LAB — URINE MICROSCOPIC-ADD ON

## 2011-06-11 LAB — GLUCOSE, CAPILLARY
Glucose-Capillary: 99 mg/dL (ref 70–99)
Glucose-Capillary: 84 mg/dL (ref 70–99)

## 2011-06-11 LAB — PROTIME-INR: Prothrombin Time: 18 seconds — ABNORMAL HIGH (ref 11.6–15.2)

## 2011-06-11 MED ORDER — POTASSIUM CHLORIDE CRYS ER 20 MEQ PO TBCR: 20.0000 meq | EXTENDED_RELEASE_TABLET | Freq: Every day | ORAL | Status: DC

## 2011-06-11 MED ORDER — OXYCODONE HCL 5 MG PO TABS: 5.0000 mg | ORAL_TABLET | ORAL | Status: AC | PRN

## 2011-06-11 MED ORDER — METOPROLOL TARTRATE 12.5 MG HALF TABLET: 12.5000 mg | ORAL_TABLET | Freq: Two times a day (BID) | ORAL | Status: DC

## 2011-06-11 MED ORDER — ASPIRIN 81 MG PO TBEC: 81.0000 mg | DELAYED_RELEASE_TABLET | Freq: Every day | ORAL | Status: AC

## 2011-06-11 MED ORDER — PATIENT'S GUIDE TO USING COUMADIN BOOK
Freq: Once | Status: AC
  Administered 2011-06-11: 18:00:00
  Filled 2011-06-11: qty 1

## 2011-06-11 MED ORDER — WARFARIN SODIUM 5 MG PO TABS: ORAL_TABLET | ORAL | Status: DC

## 2011-06-11 MED ORDER — OMEGA-3-ACID ETHYL ESTERS 1 G PO CAPS: 1.0000 g | ORAL_CAPSULE | Freq: Every day | ORAL | Status: DC

## 2011-06-11 MED ORDER — FUROSEMIDE 40 MG PO TABS: 40.0000 mg | ORAL_TABLET | Freq: Every day | ORAL | Status: DC

## 2011-06-11 MED ORDER — WARFARIN SODIUM 5 MG PO TABS
5.0000 mg | ORAL_TABLET | Freq: Once | ORAL | Status: AC
  Administered 2011-06-11: 5 mg via ORAL
  Filled 2011-06-11: qty 1

## 2011-06-11 MED ORDER — WARFARIN VIDEO
Freq: Once | Status: AC
  Administered 2011-06-11: 18:00:00

## 2011-06-11 MED ORDER — BACITRACIN-NEOMYCIN-POLYMYXIN OINTMENT TUBE
TOPICAL_OINTMENT | Freq: Every day | CUTANEOUS | Status: DC
  Administered 2011-06-11 – 2011-06-13 (×3): via TOPICAL
  Filled 2011-06-11: qty 15

## 2011-06-11 MED FILL — Heparin Sodium (Porcine) Inj 1000 Unit/ML: INTRAMUSCULAR | Qty: 40 | Status: AC

## 2011-06-11 NOTE — Discharge Summary (Signed)
Will need Chest CT when he returns for follow up appointment, mildly widened superior med on CXR

## 2011-06-11 NOTE — Discharge Summary (Signed)
[address]            [place] [address]          [phone]         Discharge Summary  Name: Andre Evans DOB: [birth year] 78 y.o. MRN: [id number]  Admission Date: 06/08/2011 Discharge Date:    Admitting Diagnosis: Severe aortic stenosis  Discharge Diagnosis:   Severe aortic stenosis  Hypertension  Hyperlipidemia  Benign prostatic hyperplasia  Hypothyroidism  Postoperative acute blood loss anemia    Procedures: AORTIC VALVE REPLACEMENT (25 Magna Ease pericardial valve) on 06/08/2011   HPI:  The patient is a 78 y.o. male with a history of heart murmur. He has been followed for this for several years. He has recently been experiencing lightheadedness and dizziness with exertion.  He noted this particularly after he had carried some heavy objects. He also noted some shortness of breath with exertion recently which is new as well. He saw Dr. Burnett Sheng and Dr. Mariah Milling. An echocardiogram revealed progression of his aortic stenosis. TTE suggested the valve area was less than 1 cm. TEE showed a valve area of 0.7 cm. There was no other significant valvular pathology. Left ventricular function was preserved. He had cardiac catheterization done by Dr. Mariah Milling, which showed no hemodynamically significant coronary artery disease, there was some mild plaquing. Interestingly, the patient says that over the past week or so he has not had any additional symptoms.  He was referred to Dr. Dorris Fetch for surgical consideration. Elective aortic valve replacement was felt to be indicated and the patient agreed to proceed with surgery.    Hospital Course:  The patient was admitted to [facility] on 06/08/2011. All risks, benefits and alternatives of surgery were explained in detail, and the patient agreed to proceed. The patient was taken to the operating room and underwent the above procedure.    The postoperative course has been generally  uneventful. He has been very volume overloaded and was started on Lasix to which he is responding well. He has maintained normal sinus rhythm. He has been started on Coumadin for his pericardial tissue valve and will remain on anticoagulants for 6 weeks. He is ambulating in the halls without difficulty. He is tolerating a regular diet and is having normal bowel and bladder function. He has had a mild postoperative anemia which has not required transfusion and a mild thrombocytopenia which is resolving. If he continues to remain stable we anticipate discharge home within the next 48 hours.   Recent vital signs:  Filed Vitals:   06/11/11 1049  BP: 128/70  Pulse: 76  Temp:   Resp:     Recent laboratory studies:  CBC: Basename 06/11/11 0542 06/10/11 0400  WBC 6.6 11.1*  HGB 10.3* 10.8*  HCT 30.5* 32.8*  PLT 99* 95*   BMET:  Basename 06/11/11 0542 06/10/11 0400  NA 135 131*  K 4.1 4.3  CL 105 100  CO2 23 24  GLUCOSE 88 138*  BUN 31* 30*  CREATININE 0.97 1.05  CALCIUM 8.7 8.3*    PT/INR:  Basename 06/11/11 0542  LABPROT 18.0*  INR 1.46    Discharge Medications:  Current Discharge Medication List    START taking these medications   Details  aspirin EC 81 MG EC tablet Take  1 tablet (81 mg total) by mouth daily.    furosemide (LASIX) 40 MG tablet Take 1 tablet (40 mg total) by mouth daily. X 7days Qty: 7 tablet, Refills: 0    metoprolol tartrate (LOPRESSOR) 12.5 mg TABS Take 0.5 tablets (12.5 mg total) by mouth 2 (two) times daily. Qty: 30 tablet, Refills: 1    omega-3 acid ethyl esters (LOVAZA) 1 G capsule Take 1 capsule (1 g total) by mouth daily. Qty: 30 capsule, Refills: 1    oxyCODONE (OXY IR/ROXICODONE) 5 MG immediate release tablet Take 1-2 tablets (5-10 mg total) by mouth every 3 (three) hours as needed for pain. Qty: 30 tablet, Refills: 0    potassium chloride SA (K-DUR,KLOR-CON) 20 MEQ tablet Take 1 tablet (20 mEq total) by mouth daily. X 7 days Qty: 7 tablet,  Refills: 0    warfarin (COUMADIN) 5 MG tablet Take 1 tablet daily or as directed by MD Qty: 30 tablet, Refills: 1      CONTINUE these medications which have NOT CHANGED   Details  Calcium-Magnesium-Zinc 1000-400-15 MG TABS Take 1 capsule by mouth daily.     cholecalciferol (VITAMIN D) 1000 UNITS tablet Take 1,000 Units by mouth daily.      co-enzyme Q-10 30 MG capsule Take 30 mg by mouth daily.     fish oil-omega-3 fatty acids 1000 MG capsule Take 1 g by mouth daily.      lisinopril (PRINIVIL,ZESTRIL) 20 MG tablet Take 20 mg by mouth daily.      Multiple Vitamins-Minerals (MULTIVITAMINS THER. W/MINERALS) TABS Take 1 tablet by mouth daily.     vitamin E 100 UNIT capsule Take 100 Units by mouth daily.        STOP taking these medications     Alfalfa TABS      Chlorophyll POWD      RED YEAST RICE EXTRACT PO         Discharge Instructions:  The patient is to refrain from driving, heavy lifting or strenuous activity.  May shower daily and clean incisions with soap and water.  May resume regular diet.  Discharge Orders    Future Appointments: Provider: Department: Dept Phone: Center:   07/05/2011 1:00 PM [facility] [phone] [id number]      Follow-up Information    Follow up with Julien Nordmann, MD. (Call for appointment in 2 weeks)    Contact information:   [address] [place] [address] [phone]       Follow up with Loreli Slot, MD on 07/05/2011. (Have a chest x-ray at 12:30, see Dr. Sunday Corn PA at 1:00)    Contact information:   [address] [place] [address] [phone]       Follow up with [facility] Nicholes Rough. (Have bloodwork (PT/INR) drawn for Coumadin monitoring within 48 hours of discharge)    Contact information:   [address] [place] [address]           Lynann Demetrius H 06/11/2011, 1:05 PM

## 2011-06-11 NOTE — Plan of Care (Signed)
Problem: Phase III Progression Outcomes Goal: Transfer to PCTU/Telemetry POD Outcome: Completed/Met Date Met:  06/11/11 06/10/11 Goal: Time patient transferred to PCTU/Telemetry POD Outcome: Completed/Met Date Met:  06/11/11 1500

## 2011-06-11 NOTE — Progress Notes (Signed)
CARDIAC REHAB PHASE I   PRE:  Rate/Rhythm: 76 SR  BP:  Supine:   Sitting: 128/70  Standing:    SaO2: 95 RA  MODE:  Ambulation: 390 ft   POST:  Rate/Rhythem: To bathroom after walk  BP:  Supine:   Sitting:   Standing:    SaO2:   1005-1100 Tolerated ambulation well without c/o. Gait steady VS stable. To bathroom after walk. Completed discharge education with pt and family. He agrees to [employer] CRP in [place], will send them information to contact him.Kizzie Bane, Nila Nephew

## 2011-06-11 NOTE — Progress Notes (Addendum)
                    [address]            [place] [address]          [phone]     3 Days Post-Op Procedure(s) (LRB): AORTIC VALVE REPLACEMENT (AVR) (N/A)  Subjective: Feels much better since Foley d/c'ed.  No complaints.  Objective: Vital signs in last 24 hours: Patient Vitals for the past 24 hrs:  BP Temp Temp src Pulse Resp SpO2 Height Weight  06/11/11 0456 115/69 mmHg 98.8 F (37.1 C) Oral 79  18  92 % - -  06/11/11 0356 - - - - - - - 84.097 kg (185 lb 6.4 oz)  06/10/11 2115 97/57 mmHg 98 F (36.7 C) Oral 79  20  92 % - -  06/10/11 1812 - - - - - - 5\' 6"  (1.676 m) -  06/10/11 1335 118/71 mmHg 99.1 F (37.3 C) Oral 80  20  91 % - -  06/10/11 1150 - 98.7 F (37.1 C) Oral - - - - -  06/10/11 1131 123/66 mmHg - - - 25  - - -  06/10/11 1007 120/59 mmHg - - - 26  - - -  06/10/11 1000 - - - - - 93 % - -   Current Weight  06/11/11 84.097 kg (185 lb 6.4 oz)   Pre-op wt= 80 kg   Intake/Output from previous day: 01/10 0701 - 01/11 0700 In: 270 [P.O.:200; I.V.:20; IV Piggyback:50] Out: 563 [Urine:562; Stool:1]    PHYSICAL EXAM:  Heart: RRR Lungs: generally clear Wound: clean and dry Extremities: mild LE edema  Lab Results: CBC: Basename 06/11/11 0542 06/10/11 0400  WBC 6.6 11.1*  HGB 10.3* 10.8*  HCT 30.5* 32.8*  PLT 99* 95*   BMET:  Basename 06/11/11 0542 06/10/11 0400  NA 135 131*  K 4.1 4.3  CL 105 100  CO2 23 24  GLUCOSE 88 138*  BUN 31* 30*  CREATININE 0.97 1.05  CALCIUM 8.7 8.3*    PT/INR:  Basename 06/11/11 0542  LABPROT 18.0*  INR 1.46   CXR- improving basilar atx, stable small bilateral effusions  Assessment/Plan: S/P Procedure(s) (LRB): AORTIC VALVE REPLACEMENT (AVR) (N/A)  CV- HR 80-90s, sinus under pacer.  Will turn pacer down to backup 50.  Hopefully can d/c pacer soon. Continue Lopressor. Vol overload- continue diuresis. Coumadin for pericardial AV x 6 weeks. CRPI, pulm toilet. Poss home Sunday/Monday if  remains stable.    LOS: 3 days    COLLINS,GINA H 06/11/2011  I have seen and examined the patient and agree with the above assessment.  Will need chest CT on follow up visit to evaluate question of mildly widened superior mediastinum on preop CXR

## 2011-06-12 LAB — GLUCOSE, CAPILLARY: Glucose-Capillary: 98 mg/dL (ref 70–99)

## 2011-06-12 LAB — PROTIME-INR: Prothrombin Time: 21.9 seconds — ABNORMAL HIGH (ref 11.6–15.2)

## 2011-06-12 MED ORDER — WARFARIN SODIUM 5 MG PO TABS
5.0000 mg | ORAL_TABLET | Freq: Every day | ORAL | Status: DC
  Administered 2011-06-12: 5 mg via ORAL
  Filled 2011-06-12 (×2): qty 1

## 2011-06-12 NOTE — Progress Notes (Signed)
EPW discontinued per protocol. Tips intact. Patient tolerated well. Last INR INR/Prothrombin Time on   .  Patient advised Bedrest X 1 hour. Demani Mcbrien Aba RN   

## 2011-06-12 NOTE — Progress Notes (Signed)
Cardiac Rehab  Pt plans to ambulate with granddaughter who is a PT at 1100. Pt did not want to ambulate with cardiac rehab. Pt and family did not have any questions re: cardiac rehab discharge education from yesterday. Will send outpatient cardiac rehab referral to [facility].  Harriett Sine MS

## 2011-06-12 NOTE — Progress Notes (Addendum)
4 Days Post-Op Procedure(s) (LRB): AORTIC VALVE REPLACEMENT (AVR) (N/A)  Subjective: Patient without complaints. Hopes to go home in am.  Objective: Vital signs in last 24 hours: Patient Vitals for the past 24 hrs:  BP Temp Temp src Pulse Resp SpO2 Weight  06/12/11 0500 - - - - - - 182 lb 14.4 oz (82.963 kg)  06/12/11 0402 110/73 mmHg 98.2 F (36.8 C) Oral 76  18  92 % 182 lb 14.4 oz (82.963 kg)  06/11/11 2115 104/64 mmHg 99 F (37.2 C) Oral 75  18  91 % -  06/11/11 1342 114/70 mmHg 98.5 F (36.9 C) Oral 70  18  97 % -   Pre op weight  80.3 kg Current Weight  06/12/11 182 lb 14.4 oz (82.963 kg)      Intake/Output from previous day: 01/11 0701 - 01/12 0700 In: 720 [P.O.:720] Out: 1601 [Urine:1600; Stool:1]   Physical Exam:  Cardiovascular: RRR, no murmurs, gallops, or rubs. Pulmonary: Mostly clear to auscultation bilaterally; no rales, wheezes, or rhonchi. Abdomen: Soft, non tender, bowel sounds present. Extremities: Mild bilateral lower extremity edema. Wound: Clean and dry.  No erythema or signs of infection.  Lab Results: CBC: Basename 06/11/11 0542 06/10/11 0400  WBC 6.6 11.1*  HGB 10.3* 10.8*  HCT 30.5* 32.8*  PLT 99* 95*   BMET:  Basename 06/11/11 0542 06/10/11 0400  NA 135 131*  K 4.1 4.3  CL 105 100  CO2 23 24  GLUCOSE 88 138*  BUN 31* 30*  CREATININE 0.97 1.05  CALCIUM 8.7 8.3*    PT/INR:  Basename 06/12/11 0515  LABPROT 21.9*  INR 1.88*   ABG:  INR: Will add last result for INR, ABG once components are confirmed Will add last 4 CBG results once components are confirmed  Assessment/Plan:  1. CV - SR. Continue Lopressor 12.5 bid, Lisinopril 20 daily, Coumadin. Has not had bradycardia in last 24 hours. Will remove EPW as INR increasing. 2.  Pulmonary - Encourage incentive spirometer. 3. Volume Overload - Continue with diuresis. 4.  Acute blood loss anemia - Last H/H 10.3/30.5. Thrombocytopenia-Platelets 99,000. 5.Possibly discharge in  am.   ZIMMERMAN,DONIELLE MPA-C 06/12/2011   patient examined and medical record reviewed,agree with above note.U/A unremarkable VAN TRIGT III,Alila Sotero 06/12/2011

## 2011-06-13 LAB — PROTIME-INR
INR: 2.59 — ABNORMAL HIGH (ref 0.00–1.49)
Prothrombin Time: 28.2 seconds — ABNORMAL HIGH (ref 11.6–15.2)

## 2011-06-13 MED ORDER — WARFARIN SODIUM 2.5 MG PO TABS: 2.5000 mg | ORAL_TABLET | Freq: Every day | ORAL | Status: DC

## 2011-06-13 MED ORDER — WARFARIN SODIUM 2.5 MG PO TABS: ORAL_TABLET | ORAL | Status: DC

## 2011-06-13 NOTE — Progress Notes (Addendum)
5 Days Post-Op Procedure(s) (LRB): AORTIC VALVE REPLACEMENT (AVR) (N/A)  Subjective: Patient feels well and is looking forward to going home.Inquired about getting a flu shot.  Objective: Vital signs in last 24 hours: Patient Vitals for the past 24 hrs:  BP Temp Temp src Pulse Resp SpO2 Weight  06/13/11 0526 135/83 mmHg 97.5 F (36.4 C) Oral 69  18  96 % -  06/13/11 0444 - - - - - - 178 lb 12.7 oz (81.1 kg)  06/12/11 2040 116/73 mmHg 98.3 F (36.8 C) Oral 79  18  93 % -  06/12/11 1500 112/70 mmHg 98.2 F (36.8 C) Oral 74  18  94 % -  06/12/11 1430 146/93 mmHg - - 74  - 96 % -  06/12/11 1415 122/72 mmHg - - 76  18  96 % -  06/12/11 1400 115/69 mmHg - - 75  18  95 % -  06/12/11 1332 117/74 mmHg - - 75  18  96 % -   Pre op weight  80.3 kg Current Weight  06/13/11 178 lb 12.7 oz (81.1 kg)      Intake/Output from previous day: 01/12 0701 - 01/13 0700 In: -  Out: 500 [Urine:500]   Physical Exam:  Cardiovascular: RRR, no murmurs, gallops, or rubs. Pulmonary: Mostly clear to auscultation bilaterally; no rales, wheezes, or rhonchi. Abdomen: Soft, non tender, bowel sounds present. Extremities: Mild bilateral lower extremity edema. Wound: Clean and dry.  No erythema or signs of infection.  Lab Results: CBC:  Basename 06/11/11 0542  WBC 6.6  HGB 10.3*  HCT 30.5*  PLT 99*   BMET:   Basename 06/11/11 0542  NA 135  K 4.1  CL 105  CO2 23  GLUCOSE 88  BUN 31*  CREATININE 0.97  CALCIUM 8.7    PT/INR:   Basename 06/13/11 0600  LABPROT 28.2*  INR 2.59*   ABG:  INR: Will add last result for INR, ABG once components are confirmed Will add last 4 CBG results once components are confirmed  Assessment/Plan:  1. CV - SR. Continue Lopressor 12.5 bid, Lisinopril 20 daily, Coumadin.  2.  Pulmonary - Encourage incentive spirometer. 3. Volume Overload - Continue with diuresis. 4.  Acute blood loss anemia - Last H/H 10.3/30.5. Thrombocytopenia-Platelets  99,000. 5.Discharge today.  ZIMMERMAN,DONIELLE MPA-C 06/13/2011   patient examined and medical record reviewed,agree with above note.  Ready for discharge VAN TRIGT III,PETER 06/13/2011

## 2011-06-13 NOTE — Discharge Summary (Signed)
Addendum Note to D/C PETR BONTEMPO is a 78 y.o. male who is S/P Procedure(s): AORTIC VALVE REPLACEMENT (AVR).  He remained afebrile, hemodynamically stable, and in sinus rhythm. Epicardial pacing wires and chest tube sutures were removed. His INR to 2.59 today. As a result, he was instructed not to take any Coumadin this evening. He will resume 2.5 mg by mouth every evening or as directed by the cardiologist. He was instructed he needed to contact Dr. Windell Hummingbird office in [place] in order to have his PT/INR drawn on Tuesday, 06/15/2011. He has been seen and evaluated and is felt surgically stable for discharge home today.  No changes to History, Physical Exam. The only change to his medications was that of Coumadin been decreased at 2.5 mg by mouth every evening or as directed by Dr. Windell Hummingbird office  Doree Fudge M PA-C 06/13/2011 1:12 PM

## 2011-06-14 ENCOUNTER — Other Ambulatory Visit: Payer: Self-pay | Admitting: Thoracic Surgery (Cardiothoracic Vascular Surgery)

## 2011-06-14 DIAGNOSIS — R222 Localized swelling, mass and lump, trunk: Secondary | ICD-10-CM

## 2011-06-15 ENCOUNTER — Ambulatory Visit (INDEPENDENT_AMBULATORY_CARE_PROVIDER_SITE_OTHER): Payer: Medicare Other | Admitting: Emergency Medicine

## 2011-06-15 ENCOUNTER — Ambulatory Visit: Payer: Medicare Other

## 2011-06-15 DIAGNOSIS — Z952 Presence of prosthetic heart valve: Secondary | ICD-10-CM

## 2011-06-15 DIAGNOSIS — I359 Nonrheumatic aortic valve disorder, unspecified: Secondary | ICD-10-CM

## 2011-06-15 DIAGNOSIS — Z954 Presence of other heart-valve replacement: Secondary | ICD-10-CM

## 2011-06-16 ENCOUNTER — Ambulatory Visit: Payer: Self-pay

## 2011-06-16 ENCOUNTER — Encounter: Payer: Medicare Other | Admitting: Emergency Medicine

## 2011-06-23 ENCOUNTER — Ambulatory Visit (INDEPENDENT_AMBULATORY_CARE_PROVIDER_SITE_OTHER): Payer: Medicare Other | Admitting: Emergency Medicine

## 2011-06-23 DIAGNOSIS — Z7901 Long term (current) use of anticoagulants: Secondary | ICD-10-CM | POA: Insufficient documentation

## 2011-06-23 DIAGNOSIS — Z954 Presence of other heart-valve replacement: Secondary | ICD-10-CM

## 2011-06-23 DIAGNOSIS — I350 Nonrheumatic aortic (valve) stenosis: Secondary | ICD-10-CM

## 2011-06-23 DIAGNOSIS — I359 Nonrheumatic aortic valve disorder, unspecified: Secondary | ICD-10-CM

## 2011-06-30 ENCOUNTER — Encounter: Payer: Self-pay | Admitting: Cardiovascular Disease

## 2011-06-30 ENCOUNTER — Ambulatory Visit (INDEPENDENT_AMBULATORY_CARE_PROVIDER_SITE_OTHER): Payer: Medicare Other | Admitting: Cardiovascular Disease

## 2011-06-30 ENCOUNTER — Ambulatory Visit (INDEPENDENT_AMBULATORY_CARE_PROVIDER_SITE_OTHER): Payer: Medicare Other | Admitting: Emergency Medicine

## 2011-06-30 DIAGNOSIS — I359 Nonrheumatic aortic valve disorder, unspecified: Secondary | ICD-10-CM

## 2011-06-30 DIAGNOSIS — Z954 Presence of other heart-valve replacement: Secondary | ICD-10-CM

## 2011-06-30 DIAGNOSIS — I350 Nonrheumatic aortic (valve) stenosis: Secondary | ICD-10-CM

## 2011-06-30 DIAGNOSIS — I739 Peripheral vascular disease, unspecified: Secondary | ICD-10-CM

## 2011-06-30 DIAGNOSIS — R011 Cardiac murmur, unspecified: Secondary | ICD-10-CM

## 2011-06-30 DIAGNOSIS — E785 Hyperlipidemia, unspecified: Secondary | ICD-10-CM

## 2011-06-30 DIAGNOSIS — I10 Essential (primary) hypertension: Secondary | ICD-10-CM

## 2011-06-30 DIAGNOSIS — Z7901 Long term (current) use of anticoagulants: Secondary | ICD-10-CM

## 2011-06-30 LAB — POCT INR: INR: 2.8

## 2011-06-30 NOTE — Assessment & Plan Note (Signed)
History of underlying carotid arterial disease. He would benefit from aggressive cholesterol management. We'll discuss this with him unless next clinic visit. He is currently not on a statin.

## 2011-06-30 NOTE — Patient Instructions (Signed)
You are doing well. No medication changes were made.  Please call us if you have new issues that need to be addressed before your next appt.  Your physician wants you to follow-up in: 6 months.  You will receive a reminder letter in the mail two months in advance. If you don't receive a letter, please call our office to schedule the follow-up appointment.   

## 2011-06-30 NOTE — Assessment & Plan Note (Signed)
History of aortic valve stenosis, recent valve replacement June 08, 2011, appears to be doing well postoperatively. No significant medication changes made.

## 2011-06-30 NOTE — Assessment & Plan Note (Signed)
Mild-to-moderate carotid arterial disease. We'll need to discuss cholesterol management and starting a low-dose statin.

## 2011-06-30 NOTE — Assessment & Plan Note (Signed)
Blood pressure is well controlled on today's visit. No changes made to the medications. In the past, he has had bradycardia at baseline. If he continues to have "wooziness" we have suggested he hold his metoprolol.

## 2011-06-30 NOTE — Progress Notes (Signed)
Patient ID: Andre Evans, male    DOB: [birth year], 78 y.o.   MRN: [id number]  HPI Comments: [name] is a very pleasant 78 year old gentleman, patient of Dr. Burnett Sheng, with a history of hypertension and hypothyroidism, episodes of dizziness, echocardiogram showing severe aortic valve stenosis, now status post bioprosthetic valve placed June 08, 2011 by Dr. Dorris Fetch. He presents for routine followup.  He reports that he feels well, has minimal chest discomfort. He does have occasional episodes of "wooziness" also with occasional tingling in his fingers. He is on warfarin for 6 weeks and has followup with Dr. Dorris Fetch next week. Overall his energy is good and he has no complaints. The woozy episodes are very brief, sometimes happen when he goes from a sitting to standing position. They are rare. Heart rate and blood pressure at home have been in a reasonable range with no low blood pressure or bradycardia.   He reports having a carotid ultrasound that showed mild to moderate calcific plaque on the left with small plaque on the right. No significant stenoses.  Previous echocardiogram showed severe aortic valve stenosis with elevated velocity, aortic valve area less than 1 cm.    EKG shows normal sinus rhythm with rate of 70 beats a minute with nonspecific ST changes to the anterior precordial leads, ST and T wave abnormality in V6, one and aVL   Outpatient Encounter Prescriptions as of 06/30/2011  Medication Sig Dispense Refill  . aspirin EC 81 MG EC tablet Take 1 tablet (81 mg total) by mouth daily.      . Calcium-Magnesium-Zinc 1000-400-15 MG TABS Take 1 capsule by mouth daily.       . cholecalciferol (VITAMIN D) 1000 UNITS tablet Take 1,000 Units by mouth daily.        Marland Kitchen co-enzyme Q-10 30 MG capsule Take 30 mg by mouth daily.       . fish oil-omega-3 fatty acids 1000 MG capsule Take 1 g by mouth daily.        Marland Kitchen lisinopril (PRINIVIL,ZESTRIL) 20 MG tablet Take 20 mg by mouth daily.         . metoprolol tartrate (LOPRESSOR) 12.5 mg TABS Take 0.5 tablets (12.5 mg total) by mouth 2 (two) times daily.  30 tablet  1  . Multiple Vitamins-Minerals (MULTIVITAMINS THER. W/MINERALS) TABS Take 1 tablet by mouth daily.       Marland Kitchen omega-3 acid ethyl esters (LOVAZA) 1 G capsule Take 1 capsule (1 g total) by mouth daily.  30 capsule  1  . vitamin E 100 UNIT capsule Take 100 Units by mouth daily.        Marland Kitchen warfarin (COUMADIN) 2.5 MG tablet Take 1 tablet daily or as directed by MD  30 tablet  1    Review of Systems  Constitutional: Negative.   HENT: Negative.   Eyes: Negative.   Respiratory: Negative.   Cardiovascular: Negative.   Gastrointestinal: Negative.   Musculoskeletal: Negative.        Leg cramps  Skin: Negative.   Neurological: Positive for dizziness.  Hematological: Negative.   Psychiatric/Behavioral: Negative.   All other systems reviewed and are negative.    BP 145/88  Pulse 71  Ht 5\' 6"  (1.676 m)  Wt 172 lb (78.019 kg)  BMI 27.76 kg/m2  Physical Exam  Nursing note and vitals reviewed. Constitutional: He is oriented to person, place, and time. He appears well-developed and well-nourished.  HENT:  Head: Normocephalic.  Nose: Nose normal.  Mouth/Throat: Oropharynx is  clear and moist.  Eyes: Conjunctivae are normal. Pupils are equal, round, and reactive to light.  Neck: Normal range of motion. Neck supple. No JVD present. Carotid bruit is present.  Cardiovascular: Normal rate, regular rhythm, S1 normal, S2 normal and intact distal pulses.  Exam reveals no gallop and no friction rub.   Murmur heard.  Crescendo systolic murmur is present with a grade of 1/6  Pulmonary/Chest: Effort normal and breath sounds normal. No respiratory distress. He has no wheezes. He has no rales. He exhibits no tenderness.  Abdominal: Soft. Bowel sounds are normal. He exhibits no distension. There is no tenderness.  Musculoskeletal: Normal range of motion. He exhibits no edema and no  tenderness.  Lymphadenopathy:    He has no cervical adenopathy.  Neurological: He is alert and oriented to person, place, and time. Coordination normal.  Skin: Skin is warm and dry. No rash noted. No erythema.  Psychiatric: He has a normal mood and affect. His behavior is normal. Judgment and thought content normal.           Assessment and Plan

## 2011-07-05 ENCOUNTER — Ambulatory Visit (INDEPENDENT_AMBULATORY_CARE_PROVIDER_SITE_OTHER): Payer: Self-pay | Admitting: Physician Assistant

## 2011-07-05 ENCOUNTER — Ambulatory Visit
Admission: RE | Admit: 2011-07-05 | Discharge: 2011-07-05 | Disposition: A | Discharge status: Home / Self Care | Payer: Medicare Other | Source: Ambulatory Visit | Attending: Thoracic Surgery (Cardiothoracic Vascular Surgery) | Admitting: Thoracic Surgery (Cardiothoracic Vascular Surgery)

## 2011-07-05 ENCOUNTER — Ambulatory Visit: Payer: Medicare Other

## 2011-07-05 VITALS — BP 138/86 | HR 64 | Resp 16 | Ht 66.0 in | Wt 172.0 lb

## 2011-07-05 DIAGNOSIS — I359 Nonrheumatic aortic valve disorder, unspecified: Secondary | ICD-10-CM

## 2011-07-05 DIAGNOSIS — R222 Localized swelling, mass and lump, trunk: Secondary | ICD-10-CM

## 2011-07-05 DIAGNOSIS — Z09 Encounter for follow-up examination after completed treatment for conditions other than malignant neoplasm: Secondary | ICD-10-CM

## 2011-07-05 MED ORDER — IOHEXOL 300 MG/ML  SOLN
75.0000 mL | Freq: Once | INTRAMUSCULAR | Status: AC | PRN
  Administered 2011-07-05: 75 mL via INTRAVENOUS

## 2011-07-05 NOTE — Progress Notes (Signed)
[address]            [place] [address]          [phone]     HPI: Patient returns for routine postoperative follow-up having undergone aortic valve replacement with 25 mm Magna Ease pericardial valve on 06/08/2011 by Dr. Dorris Fetch . The patient's postoperative course was generally uneventful and he was discharged home in good condition. He was noted on chest x-rays while in the hospital to have some widening of the superior mediastinum, and Dr. Dorris Fetch recommended a CT of the chest as an outpatient. . Since hospital discharge the patient has progressed well. He is ambulating without problem and denies any chest pain or shortness of breath. His appetite has improved. He saw Dr. Mariah Milling last week and no changes were made in his medications. His last INR was 2.8 on 06/30/2011. He is interested in cardiac rehab.   Current Outpatient Prescriptions  Medication Sig Dispense Refill  . aspirin EC 81 MG EC tablet Take 1 tablet (81 mg total) by mouth daily.      . Calcium-Magnesium-Zinc 1000-400-15 MG TABS Take 1 capsule by mouth daily.       . cholecalciferol (VITAMIN D) 1000 UNITS tablet Take 1,000 Units by mouth daily.        . fish oil-omega-3 fatty acids 1000 MG capsule Take 1 g by mouth daily.        Marland Kitchen lisinopril (PRINIVIL,ZESTRIL) 20 MG tablet Take 20 mg by mouth daily.        . metoprolol tartrate (LOPRESSOR) 12.5 mg TABS Take 0.5 tablets (12.5 mg total) by mouth 2 (two) times daily.  30 tablet  1  . Multiple Vitamins-Minerals (MULTIVITAMINS THER. W/MINERALS) TABS Take 1 tablet by mouth daily.       Marland Kitchen omega-3 acid ethyl esters (LOVAZA) 1 G capsule Take 1 capsule (1 g total) by mouth daily.  30 capsule  1  . vitamin E 100 UNIT capsule Take 100 Units by mouth daily.        Marland Kitchen warfarin (COUMADIN) 2.5 MG tablet Take 1 tablet daily or as directed by MD  30 tablet  1  . co-enzyme Q-10 30 MG capsule Take 30 mg by mouth daily.        No current  facility-administered medications for this visit.   Facility-Administered Medications Ordered in Other Visits  Medication Dose Route Frequency Provider Last Rate Last Dose  . iohexol (OMNIPAQUE) 300 MG/ML solution 75 mL  75 mL Intravenous Once PRN Medication Radiologist, MD   75 mL at 07/05/11 1345    Physical Exam: BP 138/86 HR 64 Resp 16  Wounds: Clean and dry without erythema.  Sternum is stable to palpation.  There is some fullness in the R neck area, nontender. Heart: RRR Lungs: clear Extremities: no edema   Diagnostic Tests: CT of the chest: IMPRESSION:  1. Mediastinal widening noted on recent chest radiographs is  related to massive enlargement of the thyroid gland, particularly  the right lobe of the gland which demonstrates significant  substernal extension. While this is likely representative of a  goiter, given the very heterogeneous appearance of multiple  internal calcifications, underlying thyroid neoplasm would be  difficult to exclude. Further evaluation with thyroid ultrasound  is recommended.  2. Postoperative changes have of a recent median sternotomy for  aortic valve replacement (stented bioprosthesis in situ).  3. Atherosclerosis, including left main and three-vessel coronary  artery disease. Please note that although the presence of coronary  artery calcium documents the presence of coronary artery disease,  the severity of this disease and any potential stenosis cannot be  assessed on this non-gated CT examination. Assessment for  potential risk factor modification, dietary therapy or  pharmacologic therapy may be warranted, if clinically indicated.  4. Small bilateral pleural effusions with dependent areas of  passive atelectasis in the lower lobes lungs bilaterally.  5. Ectasia of the ascending thoracic aorta (4.4 cm in diameter   Assessment/Plan: Mr. Pryor is doing very well status post aortic valve replacement. From a surgical standpoint, he may  begin to drive at this point and increase his activity as tolerated. He is encouraged to proceed with cardiac rehabilitation and continue walking daily. He will followup hereafter with Dr. Mariah Milling.  Regarding the results of his chest CT, I have reviewed the  results with Dr. Laneta Simmers, who was in the office today. The patient has mediastinal thyroid goiter which will need further evaluation by general surgery. We will refer him to Dr. Humphrey Rolls for further workup.  We will see him back on an as-needed basis.

## 2011-07-14 ENCOUNTER — Telehealth: Payer: Self-pay

## 2011-07-14 ENCOUNTER — Ambulatory Visit (INDEPENDENT_AMBULATORY_CARE_PROVIDER_SITE_OTHER): Payer: Medicare Other | Admitting: Emergency Medicine

## 2011-07-14 DIAGNOSIS — Z7901 Long term (current) use of anticoagulants: Secondary | ICD-10-CM

## 2011-07-14 DIAGNOSIS — I359 Nonrheumatic aortic valve disorder, unspecified: Secondary | ICD-10-CM

## 2011-07-14 DIAGNOSIS — I350 Nonrheumatic aortic (valve) stenosis: Secondary | ICD-10-CM

## 2011-07-14 DIAGNOSIS — Z954 Presence of other heart-valve replacement: Secondary | ICD-10-CM

## 2011-07-14 LAB — POCT INR: INR: 2.2

## 2011-07-14 MED ORDER — METOPROLOL TARTRATE 12.5 MG HALF TABLET: 12.5000 mg | ORAL_TABLET | Freq: Two times a day (BID) | ORAL | Status: DC

## 2011-07-14 MED ORDER — OMEGA-3-ACID ETHYL ESTERS 1 G PO CAPS: 1.0000 g | ORAL_CAPSULE | Freq: Every day | ORAL | Status: DC

## 2011-07-14 MED ORDER — WARFARIN SODIUM 2.5 MG PO TABS: ORAL_TABLET | ORAL | Status: DC

## 2011-07-14 NOTE — Telephone Encounter (Signed)
Pt was seen in Coumadin Clinic today.  Pt had aortic valve replacement surgery on 06/08/11, pt has a tissue valve and he was told he would have to be on Coumadin x 6 weeks post surgery.  Pt saw surgical PA 07/05/11 and Coumadin was not addressed in note, other than he was taking and it was therapeutic and being monitored by [facility].  The surgeons discharged pt to f/u in future with cardiology.  Pt will complete 6 wks Coumadin therapy on 07/20/11.  Please advise if OK to discontinue Coumadin at that time.  Thanks.

## 2011-07-15 NOTE — Telephone Encounter (Signed)
Typically 6 weeks should be good. Ok to stop warfarin

## 2011-07-16 NOTE — Telephone Encounter (Signed)
Please see Dr. Windell Hummingbird note.

## 2011-07-19 ENCOUNTER — Ambulatory Visit: Payer: Self-pay | Admitting: Cardiovascular Disease

## 2011-07-19 DIAGNOSIS — Z954 Presence of other heart-valve replacement: Secondary | ICD-10-CM

## 2011-07-19 DIAGNOSIS — I350 Nonrheumatic aortic (valve) stenosis: Secondary | ICD-10-CM

## 2011-07-19 DIAGNOSIS — Z7901 Long term (current) use of anticoagulants: Secondary | ICD-10-CM

## 2011-07-19 NOTE — Telephone Encounter (Signed)
Called spoke with pt, advised OK per Dr Mariah Milling to d/c Coumadin on 07/20/11 since he will have completed 6 wks Coumadin therapy s/p aortic tissue valve replacement surgery. Will cancel f/u appt in Coumadin clinic.

## 2011-08-02 ENCOUNTER — Other Ambulatory Visit (INDEPENDENT_AMBULATORY_CARE_PROVIDER_SITE_OTHER): Payer: Self-pay | Admitting: Surgery

## 2011-08-02 ENCOUNTER — Encounter (INDEPENDENT_AMBULATORY_CARE_PROVIDER_SITE_OTHER): Payer: Self-pay | Admitting: Surgery

## 2011-08-02 ENCOUNTER — Ambulatory Visit (INDEPENDENT_AMBULATORY_CARE_PROVIDER_SITE_OTHER): Payer: Medicare Other | Admitting: Surgery

## 2011-08-02 VITALS — BP 140/98 | HR 76 | Temp 97.8°F | Resp 18 | Ht 66.0 in | Wt 175.2 lb

## 2011-08-02 DIAGNOSIS — E041 Nontoxic single thyroid nodule: Secondary | ICD-10-CM

## 2011-08-02 DIAGNOSIS — E049 Nontoxic goiter, unspecified: Secondary | ICD-10-CM | POA: Insufficient documentation

## 2011-08-02 NOTE — Progress Notes (Signed)
Chief Complaint  Patient presents with  . Goiter    Mediastinal thyroid - referral by Dr. Evelene Croon and Dr. Andrey Spearman    HISTORY: Patient is a 78 year old white male referred by his cardiothoracic surgeons for evaluation of mediastinal thyroid goiter. Patient had undergone aortic valve replacement surgery. Chest x-ray showed widening of the mediastinum. CT scan of the chest was obtained and showed a markedly enlarged thyroid goiter extending into the anterior mediastinum. Right thyroid lobe was significantly enlarged and extended to the level of the aortic arch. It measured 8 cm in greatest dimension. It contained multiple calcifications. Patient is now referred for evaluation.  Patient notes that he had previously been told he had a thyroid goiter. He had undergone a previous thyroid ultrasound. His physician had recommended thyroid hormone supplementation but the patient never had any further followup and never started on thyroid hormone.  The patient notes that his mother had a thyroid goiter and underwent surgical resection. There is no family history of thyroid cancer. There is no family history of other endocrinopathy.  Past Medical History  Diagnosis Date  . HLD (hyperlipidemia)     mixed  . Hemorrhoids   . History of cardiac murmur     with a leaky valve  . BPH (benign prostatic hyperplasia)   . Heart murmur     for years   . HTN (hypertension)     takes Lisinopril daily  . HTN (hypertension)   . Coronary artery disease   . Lightheadedness   . Dizziness   . Aortic stenosis   . Arthritis   . Heat rash     occasionally  . Constipation   . Hypothyroidism     thyroid goiter  . Urinary frequency   . Impaired hearing      Current Outpatient Prescriptions  Medication Sig Dispense Refill  . aspirin EC 81 MG EC tablet Take 1 tablet (81 mg total) by mouth daily.      . Calcium-Magnesium-Zinc 1000-400-15 MG TABS Take 1 capsule by mouth daily.       .  cholecalciferol (VITAMIN D) 1000 UNITS tablet Take 1,000 Units by mouth daily.        Marland Kitchen co-enzyme Q-10 30 MG capsule Take 30 mg by mouth daily.       . fish oil-omega-3 fatty acids 1000 MG capsule Take 1 g by mouth daily.        Marland Kitchen lisinopril (PRINIVIL,ZESTRIL) 20 MG tablet Take 20 mg by mouth daily.        . metoprolol tartrate (LOPRESSOR) 12.5 mg TABS Take 0.5 tablets (12.5 mg total) by mouth 2 (two) times daily.  30 tablet  6  . Multiple Vitamins-Minerals (MULTIVITAMINS THER. W/MINERALS) TABS Take 1 tablet by mouth daily.       Marland Kitchen omega-3 acid ethyl esters (LOVAZA) 1 G capsule Take 1 capsule (1 g total) by mouth daily.  30 capsule  6  . vitamin E 100 UNIT capsule Take 100 Units by mouth daily.           No Known Allergies   Family History  Problem Relation Age of Onset  . Coronary artery disease      family hx  . Diabetes      family hx  . Heart failure      family hx - CHF  . Diabetes Father   . Hypertension Mother      History   Social History  . Marital Status: Married  Spouse Name: N/A    Number of Children: N/A  . Years of Education: N/A   Occupational History  . Retired   . Retired Other   Social History Main Topics  . Smoking status: Never Smoker   . Smokeless tobacco: Never Used   Comment: tobacco use - no  . Alcohol Use: No  . Drug Use: No  . Sexually Active: No   Other Topics Concern  . None   Social History Narrative   ** Merged History Encounter ** ** Data from: 03/17/11 [facility], retired, gets regular excercise.  ** Data from: 04/01/11 Enc [facility]He is married. Has children. Is a retired Company secretary. Exercises 3 times a week about 15 minutes per time.     REVIEW OF SYSTEMS - PERTINENT POSITIVES ONLY: Denies tremor. Denies palpitations. Denies dysphagia. Denies other compressive symptoms. Occasional globus sensation.  EXAM: Filed Vitals:   08/02/11 1120  BP: 140/98  Pulse: 76  Temp:  97.8 F (36.6 C)  Resp: 18    HEENT: normocephalic; pupils equal and reactive; sclerae clear; dentition good; mucous membranes moist NECK:  Palpable right thyroid lobe with moderate enlargement extending beneath the right clavicle. Left lobe is smaller but relatively firm and also extends beneath the clavicle. No discrete or dominant nodules; symmetric on extension; no palpable anterior or posterior cervical lymphadenopathy; no supraclavicular masses; no tenderness CHEST: clear to auscultation bilaterally without rales, rhonchi, or wheezes CARDIAC: regular rate and rhythm without significant murmur; peripheral pulses are full; Median sternotomy wound is well healed EXT:  non-tender without edema; no deformity NEURO: no gross focal deficits; no sign of tremor   LABORATORY RESULTS: See [facility] ([facility]) for most recent results   RADIOLOGY RESULTS: See [facility] ([facility]) for most recent results   IMPRESSION: #1 substernal thyroid goiter with dystrophic calcifications #2 probable hypothyroidism #3 recent aortic valve replacement surgery  PLAN: I discussed all the above findings with the patient and his family. We reviewed his CT scan today together. I am going to request a thyroid ultrasound and a concurrent fine-needle aspiration biopsy. We will also obtain a TSH level, a total T3 level and a total T4 level for review.  Once these studies are completed the patient and his family will return to review the results and to discuss possible surgical intervention.  Velora Heckler, MD, FACS General & Endocrine Surgery [facility]   Visit Diagnoses: 1. Substernal thyroid goiter     Primary Care Physician: Jerl Mina, MD, MD  Cardiothoracic Surgeons:  Dr. Andrey Spearman and Dr. Evelene Croon

## 2011-08-05 ENCOUNTER — Inpatient Hospital Stay
Admission: RE | Admit: 2011-08-05 | Discharge: 2011-08-05 | Payer: Medicare Other | Source: Ambulatory Visit | Attending: Surgery | Admitting: Surgery

## 2011-08-05 ENCOUNTER — Other Ambulatory Visit: Payer: Medicare Other

## 2011-08-06 ENCOUNTER — Other Ambulatory Visit: Payer: Self-pay

## 2011-08-06 MED ORDER — METOPROLOL TARTRATE 25 MG PO TABS: 12.5000 mg | ORAL_TABLET | Freq: Two times a day (BID) | ORAL | Status: DC

## 2011-08-10 ENCOUNTER — Ambulatory Visit
Admission: RE | Admit: 2011-08-10 | Discharge: 2011-08-10 | Disposition: A | Discharge status: Home / Self Care | Payer: Medicare Other | Source: Ambulatory Visit | Attending: Surgery | Admitting: Surgery

## 2011-08-10 ENCOUNTER — Other Ambulatory Visit (INDEPENDENT_AMBULATORY_CARE_PROVIDER_SITE_OTHER): Payer: Self-pay | Admitting: Surgery

## 2011-08-10 DIAGNOSIS — E041 Nontoxic single thyroid nodule: Secondary | ICD-10-CM

## 2011-08-11 LAB — T4: T4, Total: 5.5 ug/dL (ref 5.0–12.5)

## 2011-08-11 LAB — T3: T3, Total: 118.2 ng/dL (ref 80.0–204.0)

## 2011-09-02 ENCOUNTER — Ambulatory Visit (INDEPENDENT_AMBULATORY_CARE_PROVIDER_SITE_OTHER): Payer: Medicare Other | Admitting: Surgery

## 2011-09-02 ENCOUNTER — Encounter (INDEPENDENT_AMBULATORY_CARE_PROVIDER_SITE_OTHER): Payer: Self-pay | Admitting: Surgery

## 2011-09-02 VITALS — BP 150/100 | HR 60 | Temp 98.5°F | Resp 20 | Ht 66.0 in | Wt 179.8 lb

## 2011-09-02 DIAGNOSIS — E049 Nontoxic goiter, unspecified: Secondary | ICD-10-CM

## 2011-09-02 MED ORDER — SYNTHROID 50 MCG PO TABS: 50.0000 ug | ORAL_TABLET | Freq: Every day | ORAL | Status: DC

## 2011-09-02 NOTE — Progress Notes (Signed)
Visit Diagnoses: 1. Substernal thyroid goiter     HISTORY: Patient is a 78 year old white male referred by cardiothoracic surgery with substernal thyroid goiter. At my request he underwent a thyroid ultrasound performed on August 10, 2011. This showed an enlarged thyroid gland consistent with multinodular goiter. There were no well-defined nodules. There were some dysmorphic calcifications but nothing that was suspicious for malignancy.  Biopsy was not performed.  Laboratory studies were obtained and showed a TSH level of 5.184 with T3 and T4 levels in the low range of normal.  PERTINENT REVIEW OF SYSTEMS: Patient is asymptomatic. No tremor. No palpitations. No dysphagia. No dyspnea.  EXAM: HEENT: normocephalic; pupils equal and reactive; sclerae clear; dentition good; mucous membranes moist NECK:  symmetric on extension; no palpable anterior or posterior cervical lymphadenopathy; no supraclavicular masses; no tenderness; Thyroid lobes are palpable above the level of the clavicle. They are slightly firm. The thyroid extends bilaterally beneath the clavicle. CHEST: clear to auscultation bilaterally without rales, rhonchi, or wheezes CARDIAC: regular rate and rhythm without significant murmur; peripheral pulses are full EXT:  non-tender without edema; no deformity NEURO: no gross focal deficits; no sign of tremor   IMPRESSION: Multinodular thyroid goiter with substernal component, mild hypothyroidism  PLAN: I discussed all the above findings with the patient and his family. At this point in time I do not see a strong indication for thyroidectomy. I would like to start the patient on low-dose thyroid hormone supplementation with the goal of obtaining a TSH level between 1.0 and 2.0. I am going to start him on Synthroid 50 mcg daily. We will check a TSH level in 6 weeks.  Patient will undergo followup thyroid ultrasound in 6 months. He will return to see me following that study for review of  the results and physical examination.  Velora Heckler, MD, FACS General & Endocrine Surgery [facility]

## 2011-09-02 NOTE — Patient Instructions (Signed)
Thyroid Diseases Your thyroid is a butterfly-shaped gland in your neck. It is located just above your collarbone. It is one of your endocrine glands, which make hormones. The thyroid helps set your metabolism. Metabolism is how your body gets energy from the foods you eat.  Millions of people have thyroid diseases. Women experience thyroid problems more often than men. In fact, overactive thyroid problems (hyperthyroidism) occur in 1% of all women. If you have a thyroid disease, your body may use energy more slowly or quickly than it should.  Thyroid problems also include an immune disease where your body reacts against your thyroid gland (called thyroiditis). A different problem involves lumps and bumps (called nodules) that develop in the gland. The nodules are usually, but not always, noncancerous. THE MOST COMMON THYROID PROBLEMS AND CAUSES ARE DISCUSSED BELOW There are many causes for thyroid problems. Treatment depends upon the exact diagnosis and includes trying to reset your body's metabolism to a normal rate. Hyperthyroidism Too much thyroid hormone from an overactive thyroid gland is called hyperthyroidism. In hyperthyroidism, the body's metabolism speeds up. One of the most frequent forms of hyperthyroidism is known as Graves' disease. Graves' disease tends to run in families. Although Graves' is thought to be caused by a problem with the immune system, the exact nature of the genetic problem is unknown. Hypothyroidism Too little thyroid hormone from an underactive thyroid gland is called hypothyroidism. In hypothyroidism, the body's metabolism is slowed. Several things can cause this condition. Most causes affect the thyroid gland directly and hurt its ability to make enough hormone.  Rarely, there may be a pituitary gland tumor (located near the base of the brain). The tumor can block the pituitary from producing thyroid-stimulating hormone (TSH). Your body makes TSH to stimulate the thyroid  to work properly. If the pituitary does not make enough TSH, the thyroid fails to make enough hormones needed for good health. Whether the problem is caused by thyroid conditions or by the pituitary gland, the result is that the thyroid is not making enough hormones. Hypothyroidism causes many physical and mental processes to become sluggish. The body consumes less oxygen and produces less body heat. Thyroid Nodules A thyroid nodule is a small swelling or lump in the thyroid gland. They are common. These nodules represent either a growth of thyroid tissue or a fluid-filled cyst. Both form a lump in the thyroid gland. Almost half of all people will have tiny thyroid nodules at some point in their lives. Typically, these are not noticeable until they become large and affect normal thyroid size. Larger nodules that are greater than a half inch across (about 1 centimeter) occur in about 5 percent of people. Although most nodules are not cancerous, people who have them should seek medical care to rule out cancer. Also, some thyroid nodules may produce too much thyroid hormone or become too large. Large nodules or a large gland can interfere with breathing or swallowing or may cause neck discomfort. Other problems Other thyroid problems include cancer and thyroiditis. Thyroiditis is a malfunction of the body's immune system. Normally, the immune system works to defend the body against infection and other problems. When the immune system is not working properly, it may mistakenly attack normal cells, tissues, and organs. Examples of autoimmune diseases are Hashimoto's thyroiditis (which causes low thyroid function) and Graves' disease (which causes excess thyroid function). SYMPTOMS  Symptoms vary greatly depending upon the exact type of problem with the thyroid. Hyperthyroidism-is when your thyroid is too   active and makes more thyroid hormone than your body needs. The most common cause is Graves' Disease. Too  much thyroid hormone can cause some or all of the following symptoms:  Anxiety.   Irritability.   Difficulty sleeping.   Fatigue.   A rapid or irregular heartbeat.   A fine tremor of your hands or fingers.   An increase in perspiration.   Sensitivity to heat.   Weight loss, despite normal food intake.   Brittle hair.   Enlargement of your thyroid gland (goiter).   Light menstrual periods.   Frequent bowel movements.  Graves' disease can specifically cause eye and skin problems. The skin problems involve reddening and swelling of the skin, often on your shins and on the top of your feet. Eye problems can include the following:  Excess tearing and sensation of grit or sand in either or both eyes.   Reddened or inflamed eyes.   Widening of the space between your eyelids.   Swelling of the lids and tissues around the eyes.   Light sensitivity.   Ulcers on the cornea.   Double vision.   Limited eye movements.   Blurred or reduced vision.  Hypothyroidism- is when your thyroid gland is not active enough. This is more common than hyperthyroidism. Symptoms can vary a lot depending of the severity of the hormone deficiency. Symptoms may develop over a long period of time and can include several of the following:  Fatigue.   Sluggishness.   Increased sensitivity to cold.   Constipation.   Pale, dry skin.   A puffy face.   Hoarse voice.   High blood cholesterol level.   Unexplained weight gain.   Muscle aches, tenderness and stiffness.   Pain, stiffness or swelling in your joints.   Muscle weakness.   Heavier than normal menstrual periods.   Brittle fingernails and hair.   Depression.  Thyroid Nodules - most do not cause signs or symptoms. Occasionally, some may become so large that you can feel or even see the swelling at the base of your neck. You may realize a lump or swelling is there when you are shaving or putting on makeup. Men might become  aware of a nodule when shirt collars suddenly feel too tight. Some nodules produce too much thyroid hormone. This can produce the same symptoms as hyperthyroidism (see above). Thyroid nodules are seldom cancerous. However, a nodule is more likely to be malignant (cancerous) if it:  Grows quickly or feels hard.   Causes you to become hoarse or to have trouble swallowing or breathing.   Causes enlarged lymph nodes under your jaw or in your neck.  DIAGNOSIS  Because there are so many possible thyroid conditions, your caregiver may ask for a number of tests. They will do this in order to narrow down the exact diagnosis. These tests can include:  Blood and antibody tests.   Special thyroid scans using small, safe amounts of radioactive iodine.   Ultrasound of the thyroid gland (particularly if there is a nodule or lump).   Biopsy. This is usually done with a special needle. A needle biopsy is a procedure to obtain a sample of cells from the thyroid. The tissue will be tested in a lab and examined under a microscope.  TREATMENT  Treatment depends on the exact diagnosis. Hyperthyroidism  Beta-blockers help relieve many of the symptoms.   Anti-thyroid medications prevent the thyroid from making excess hormones.   Radioactive iodine treatment can destroy overactive thyroid   cells. The iodine can permanently decrease the amount of hormone produced.   Surgery to remove the thyroid gland.   Treatments for eye problems that come from Graves' disease also include medications and special eye surgery, if felt to be appropriate.  Hypothyroidism Thyroid replacement with levothyroxine is the mainstay of treatment. Treatment with thyroid replacement is usually lifelong and will require monitoring and adjustment from time to time. Thyroid Nodules  Watchful waiting. If a small nodule causes no symptoms or signs of cancer on biopsy, then no treatment may be chosen at first. Re-exam and re-checking blood  tests would be the recommended follow-up.   Anti-thyroid medications or radioactive iodine treatment may be recommended if the nodules produce too much thyroid hormone (see Treatment for Hyperthyroidism above).   Alcohol ablation. Injections of small amounts of ethyl alcohol (ethanol) can cause a non-cancerous nodule to shrink in size.   Surgery (see Treatment for Hyperthyroidism above).  HOME CARE INSTRUCTIONS   Take medications as instructed.   Follow through on recommended testing.  SEEK MEDICAL CARE IF:   You feel that you are developing symptoms of Hyperthyroidism or Hypothyroidism as described above.   You develop a new lump/nodule in the neck/thyroid area that you had not noticed before.   You feel that you are having side effects from medicines prescribed.   You develop trouble breathing or swallowing.  SEEK IMMEDIATE MEDICAL CARE IF:   You develop a fever of 102 F (38.9 C) or higher.   You develop severe sweating.   You develop palpitations and/or rapid heart beat.   You develop shortness of breath.   You develop nausea and vomiting.   You develop extreme shakiness.   You develop agitation.   You develop lightheadedness or have a fainting episode.  Document Released: 03/14/2007 Document Revised: 05/06/2011 Document Reviewed: 03/14/2007 [employer] Patient Information 2012 [employer]. 

## 2011-10-09 ENCOUNTER — Other Ambulatory Visit: Payer: Self-pay | Admitting: Physician Assistant

## 2011-10-11 ENCOUNTER — Other Ambulatory Visit (INDEPENDENT_AMBULATORY_CARE_PROVIDER_SITE_OTHER): Payer: Self-pay | Admitting: Surgery

## 2011-10-12 LAB — TSH: TSH: 2.872 u[IU]/mL (ref 0.350–4.500)

## 2011-10-18 ENCOUNTER — Ambulatory Visit (INDEPENDENT_AMBULATORY_CARE_PROVIDER_SITE_OTHER): Payer: Medicare Other | Admitting: Cardiovascular Disease

## 2011-10-18 ENCOUNTER — Encounter: Payer: Self-pay | Admitting: Cardiovascular Disease

## 2011-10-18 VITALS — BP 177/97 | HR 59 | Ht 66.0 in | Wt 184.0 lb

## 2011-10-18 DIAGNOSIS — E785 Hyperlipidemia, unspecified: Secondary | ICD-10-CM

## 2011-10-18 DIAGNOSIS — I739 Peripheral vascular disease, unspecified: Secondary | ICD-10-CM

## 2011-10-18 DIAGNOSIS — I779 Disorder of arteries and arterioles, unspecified: Secondary | ICD-10-CM

## 2011-10-18 DIAGNOSIS — I350 Nonrheumatic aortic (valve) stenosis: Secondary | ICD-10-CM

## 2011-10-18 DIAGNOSIS — I359 Nonrheumatic aortic valve disorder, unspecified: Secondary | ICD-10-CM

## 2011-10-18 DIAGNOSIS — I10 Essential (primary) hypertension: Secondary | ICD-10-CM

## 2011-10-18 MED ORDER — AMLODIPINE BESYLATE 5 MG PO TABS: 5.0000 mg | ORAL_TABLET | Freq: Every day | ORAL | Status: DC

## 2011-10-18 NOTE — Assessment & Plan Note (Signed)
12 pound weight gain with increase in his blood pressure. We have suggested he watch his diet, increase his exercise and start amlodipine 5 mg daily. I suggested he call our office if his blood pressure continues to be elevated and we'll increase the amlodipine to 10 mg daily.

## 2011-10-18 NOTE — Patient Instructions (Addendum)
You are doing well. Please start amlodipine 5 mg once a day Monitor your blood pressure If it continues to be elevated, we may need to increase the amlodipine to 10 mg once a day  Come in for lipid panel , fasting  Please call us if you have new issues that need to be addressed before your next appt.  Your physician wants you to follow-up in: 6 months.  You will receive a reminder letter in the mail two months in advance. If you don't receive a letter, please call our office to schedule the follow-up appointment.

## 2011-10-18 NOTE — Assessment & Plan Note (Signed)
Mild-to-moderate carotid arterial disease by report. We'll need to a more aggressive with his cholesterol.

## 2011-10-18 NOTE — Assessment & Plan Note (Signed)
We have ordered some cholesterol check for him. Uncertain why he is on lovaza. We'll evaluate his triglycerides.

## 2011-10-18 NOTE — Assessment & Plan Note (Signed)
Status post aortic valve replacement with bioprosthetic valve. Doing well postoperatively.

## 2011-10-18 NOTE — Progress Notes (Signed)
Patient ID: Andre Evans, male    DOB: June 09, 1933, 78 y.o.   MRN: [id number]  HPI Comments: Andre Evans is a very pleasant 78 year old gentleman, patient of Dr. Burnett Sheng, with a history of hypertension and hypothyroidism, episodes of dizziness, echocardiogram showing severe aortic valve stenosis, now status post bioprosthetic valve placed June 08, 2011 by Dr. Dorris Fetch. He presents for routine followup.  He reports that he feels well, has minimal chest discomfort.  Overall his energy is good and he has no complaints. He has been reporting elevated blood pressure at home with systolic pressures in the 150 range. Heart rate has been well-controlled in the 60s. His weight is up 12 pounds from his last clinic visit.   He reports having a carotid ultrasound that showed mild to moderate calcific plaque on the left with small plaque on the right. No significant stenoses.  Previous echocardiogram showed severe aortic valve stenosis with elevated velocity, aortic valve area less than 1 cm.    EKG shows normal sinus rhythm with rate of 59 beats a minute with nonspecific ST changes to the anterior precordial leads, ST and T wave abnormality in V6, one and aVL  Outpatient Encounter Prescriptions as of 10/18/2011  Medication Sig Dispense Refill  . aspirin EC 81 MG EC tablet Take 1 tablet (81 mg total) by mouth daily.      . Calcium-Magnesium-Zinc 1000-400-15 MG TABS Take 1 capsule by mouth daily.       . cholecalciferol (VITAMIN D) 1000 UNITS tablet Take 1,000 Units by mouth daily.        Marland Kitchen co-enzyme Q-10 30 MG capsule Take 30 mg by mouth daily.       . fish oil-omega-3 fatty acids 1000 MG capsule Take 1 g by mouth daily.        Marland Kitchen lisinopril (PRINIVIL,ZESTRIL) 20 MG tablet Take 20 mg by mouth 2 (two) times daily.       . metoprolol tartrate (LOPRESSOR) 12.5 mg TABS Take 12.5 mg by mouth 2 (two) times daily.      . Multiple Vitamins-Minerals (MULTIVITAMINS THER. W/MINERALS) TABS Take 1 tablet by mouth  daily.       Marland Kitchen omega-3 acid ethyl esters (LOVAZA) 1 G capsule Take 1 capsule (1 g total) by mouth daily.  30 capsule  6  . SYNTHROID 50 MCG tablet Take 1 tablet (50 mcg total) by mouth daily.  30 tablet  6   Review of Systems  Constitutional: Positive for unexpected weight change.  HENT: Negative.   Eyes: Negative.   Respiratory: Negative.   Cardiovascular: Negative.   Gastrointestinal: Negative.   Musculoskeletal: Negative.        Leg cramps  Skin: Negative.   Hematological: Negative.   Psychiatric/Behavioral: Negative.   All other systems reviewed and are negative.    BP 177/97  Pulse 59  Ht 5\' 6"  (1.676 m)  Wt 184 lb (83.462 kg)  BMI 29.70 kg/m2  Physical Exam  Nursing note and vitals reviewed. Constitutional: He is oriented to person, place, and time. He appears well-developed and well-nourished.  HENT:  Head: Normocephalic.  Nose: Nose normal.  Mouth/Throat: Oropharynx is clear and moist.  Eyes: Conjunctivae are normal. Pupils are equal, round, and reactive to light.  Neck: Normal range of motion. Neck supple. No JVD present. Carotid bruit is present.  Cardiovascular: Normal rate, regular rhythm, S1 normal, S2 normal and intact distal pulses.  Exam reveals no gallop and no friction rub.   Murmur heard.  Crescendo systolic murmur is present with a grade of 1/6  Pulmonary/Chest: Effort normal and breath sounds normal. No respiratory distress. He has no wheezes. He has no rales. He exhibits no tenderness.  Abdominal: Soft. Bowel sounds are normal. He exhibits no distension. There is no tenderness.  Musculoskeletal: Normal range of motion. He exhibits no edema and no tenderness.  Lymphadenopathy:    He has no cervical adenopathy.  Neurological: He is alert and oriented to person, place, and time. Coordination normal.  Skin: Skin is warm and dry. No rash noted. No erythema.  Psychiatric: He has a normal mood and affect. His behavior is normal. Judgment and thought content  normal.           Assessment and Plan

## 2011-10-21 ENCOUNTER — Ambulatory Visit (INDEPENDENT_AMBULATORY_CARE_PROVIDER_SITE_OTHER): Payer: Medicare Other

## 2011-10-21 DIAGNOSIS — E785 Hyperlipidemia, unspecified: Secondary | ICD-10-CM

## 2011-10-22 ENCOUNTER — Telehealth: Payer: Self-pay | Admitting: *Deleted

## 2011-10-22 ENCOUNTER — Other Ambulatory Visit: Payer: Self-pay | Admitting: *Deleted

## 2011-10-22 DIAGNOSIS — E785 Hyperlipidemia, unspecified: Secondary | ICD-10-CM

## 2011-10-22 LAB — LIPID PANEL
Chol/HDL Ratio: 6.3 ratio — ABNORMAL HIGH (ref 0.0–5.0)
Cholesterol, Total: 208 mg/dL — ABNORMAL HIGH (ref 100–199)
HDL: 33 mg/dL — ABNORMAL LOW
LDL Calculated: 147 mg/dL — ABNORMAL HIGH (ref 0–99)
Triglycerides: 140 mg/dL (ref 0–149)
VLDL Cholesterol Cal: 28 mg/dL (ref 5–40)

## 2011-10-22 MED ORDER — ATORVASTATIN CALCIUM 10 MG PO TABS: 10.0000 mg | ORAL_TABLET | Freq: Every day | ORAL | Status: DC

## 2011-10-22 NOTE — Telephone Encounter (Signed)
Message copied by Tarri Fuller on Fri Oct 22, 2011  3:15 PM ------      Message from: Phoebe Sharps      Created: Fri Oct 22, 2011  8:09 AM       Cholesterol is too high      Would change lovaza to over the counter fish oil,      Start atorvastatin 10 mg daily once daily with cholesterol check in 3 months.

## 2012-02-04 ENCOUNTER — Other Ambulatory Visit (INDEPENDENT_AMBULATORY_CARE_PROVIDER_SITE_OTHER): Payer: Medicare Other

## 2012-02-04 DIAGNOSIS — E785 Hyperlipidemia, unspecified: Secondary | ICD-10-CM

## 2012-02-04 LAB — HEPATIC FUNCTION PANEL
Albumin: 4.1 g/dL (ref 3.5–5.2)
Alkaline Phosphatase: 54 U/L (ref 39–117)
Total Protein: 6.8 g/dL (ref 6.0–8.3)

## 2012-02-04 LAB — LIPID PANEL
Cholesterol: 185 mg/dL (ref 0–200)
HDL: 32 mg/dL — ABNORMAL LOW (ref >39.00)
LDL Cholesterol: 123 mg/dL — ABNORMAL HIGH (ref 0–99)
Total CHOL/HDL Ratio: 6
Triglycerides: 149 mg/dL (ref 0.0–149.0)
VLDL: 29.8 mg/dL (ref 0.0–40.0)

## 2012-02-10 ENCOUNTER — Ambulatory Visit
Admission: RE | Admit: 2012-02-10 | Discharge: 2012-02-10 | Disposition: A | Discharge status: Home / Self Care | Payer: Medicare Other | Source: Ambulatory Visit | Attending: Surgery | Admitting: Surgery

## 2012-02-10 DIAGNOSIS — E049 Nontoxic goiter, unspecified: Secondary | ICD-10-CM

## 2012-02-11 ENCOUNTER — Encounter (INDEPENDENT_AMBULATORY_CARE_PROVIDER_SITE_OTHER): Payer: Self-pay

## 2012-02-11 ENCOUNTER — Telehealth (INDEPENDENT_AMBULATORY_CARE_PROVIDER_SITE_OTHER): Payer: Self-pay

## 2012-02-11 NOTE — Telephone Encounter (Signed)
Ultrasound report here in epic. I could not leave msg on pts phone to make appt. Voicemail was full. Recall letter mailed to pt to make appt.

## 2012-03-07 ENCOUNTER — Other Ambulatory Visit: Payer: Self-pay | Admitting: Cardiovascular Disease

## 2012-03-07 NOTE — Telephone Encounter (Signed)
Refilled Metoprolol

## 2012-04-04 ENCOUNTER — Other Ambulatory Visit: Payer: Self-pay | Admitting: Cardiovascular Disease

## 2012-04-04 ENCOUNTER — Other Ambulatory Visit (INDEPENDENT_AMBULATORY_CARE_PROVIDER_SITE_OTHER): Payer: Self-pay | Admitting: Surgery

## 2012-04-11 ENCOUNTER — Other Ambulatory Visit: Payer: Self-pay | Admitting: Cardiovascular Disease

## 2012-04-11 NOTE — Telephone Encounter (Signed)
Refilled Lisinopril. 

## 2012-04-20 ENCOUNTER — Ambulatory Visit (INDEPENDENT_AMBULATORY_CARE_PROVIDER_SITE_OTHER): Payer: Medicare Other | Admitting: Surgery

## 2012-04-20 ENCOUNTER — Encounter (INDEPENDENT_AMBULATORY_CARE_PROVIDER_SITE_OTHER): Payer: Self-pay | Admitting: Surgery

## 2012-04-20 VITALS — BP 122/64 | HR 68 | Temp 97.5°F | Resp 20 | Ht 66.0 in | Wt 178.0 lb

## 2012-04-20 DIAGNOSIS — E049 Nontoxic goiter, unspecified: Secondary | ICD-10-CM

## 2012-04-20 MED ORDER — LEVOTHYROXINE SODIUM 50 MCG PO TABS: 50.0000 ug | ORAL_TABLET | Freq: Every day | ORAL | Status: DC

## 2012-04-20 NOTE — Patient Instructions (Signed)
Levothyroxine tablets What is this medicine? LEVOTHYROXINE (lee voe thye ROX een) is a thyroid hormone. This medicine can improve symptoms of thyroid deficiency such as slow speech, lack of energy, weight gain, hair loss, dry skin, and feeling cold. It also helps to treat goiter (an enlarged thyroid gland). It is also used to treat some kinds of thyroid cancer along with surgery and other medicines. This medicine may be used for other purposes; ask your health care provider or pharmacist if you have questions. What should I tell my health care provider before I take this medicine? They need to know if you have any of these conditions: -angina -blood clotting problems -diabetes -dieting or on a weight loss program -fertility problems -heart disease -high levels of thyroid hormone -pituitary gland problem -previous heart attack -an unusual or allergic reaction to levothyroxine, thyroid hormones, other medicines, foods, dyes, or preservatives -pregnant or trying to get pregnant -breast-feeding How should I use this medicine? Take this medicine by mouth with plenty of water. It is best to take on an empty stomach, at least 30 minutes before or 2 hours after food. Follow the directions on the prescription label. Take at the same time each day. Do not take your medicine more often than directed. Contact your pediatrician regarding the use of this medicine in children. While this drug may be prescribed for children and infants as young as a few days of age for selected conditions, precautions do apply. For infants, you may crush the tablet and place in a small amount of (5-10 ml or 1 to 2 teaspoonfuls) of water, breast milk, or non-soy based infant formula. Do not mix with soy-based infant formula. Give as directed. Overdosage: If you think you have taken too much of this medicine contact a poison control center or emergency room at once. NOTE: This medicine is only for you. Do not share this medicine  with others. What if I miss a dose? If you miss a dose, take it as soon as you can. If it is almost time for your next dose, take only that dose. Do not take double or extra doses. What may interact with this medicine? -amiodarone -antacids -anti-thyroid medicines -calcium supplements -carbamazepine -cholestyramine -colestipol -digoxin -male hormones, including contraceptive or birth control pills -iron supplements -ketamine -liquid nutrition products like Ensure -medicines for colds and breathing difficulties -medicines for diabetes -medicines for mental depression -medicines or herbals used to decrease weight or appetite -phenobarbital or other barbiturate medications -phenytoin -prednisone or other corticosteroids -rifabutin -rifampin -soy isoflavones -sucralfate -theophylline -warfarin This list may not describe all possible interactions. Give your health care provider a list of all the medicines, herbs, non-prescription drugs, or dietary supplements you use. Also tell them if you smoke, drink alcohol, or use illegal drugs. Some items may interact with your medicine. What should I watch for while using this medicine? Be sure to take this medicine with plenty of fluids. Some tablets may cause choking, gagging, or difficulty swallowing from the tablet getting stuck in your throat. Most of these problems disappear if the medicine is taken with the right amount of water or other fluids. Do not switch brands of this medicine unless your health care professional agrees with the change. Ask questions if you are uncertain. You will need regular exams and occasional blood tests to check the response to treatment. If you are receiving this medicine for an underactive thyroid, it may be several weeks before you notice an improvement. Check with your doctor or  health care professional if your symptoms do not improve. It may be necessary for you to take this medicine for the rest of your  life. Do not stop using this medicine unless your doctor or health care professional advises you to. This medicine can affect blood sugar levels. If you have diabetes, check your blood sugar as directed. You may lose some of your hair when you first start treatment. With time, this usually corrects itself. If you are going to have surgery, tell your doctor or health care professional that you are taking this medicine. What side effects may I notice from receiving this medicine? Side effects that you should report to your doctor or health care professional as soon as possible: -allergic reactions like skin rash, itching or hives, swelling of the face, lips, or tongue -chest pain -excessive sweating or intolerance to heat -fast or irregular heartbeat -nervousness -skin rash or hives -swelling of ankles, feet, or legs -tremors Side effects that usually do not require medical attention (report to your doctor or health care professional if they continue or are bothersome): -changes in appetite -changes in menstrual periods -diarrhea -hair loss -headache -trouble sleeping -weight loss This list may not describe all possible side effects. Call your doctor for medical advice about side effects. You may report side effects to FDA at 1-800-FDA-1088. Where should I keep my medicine? Keep out of the reach of children. Store at room temperature between 15 and 30 degrees C (59 and 86 degrees F). Protect from light and moisture. Keep container tightly closed. Throw away any unused medicine after the expiration date. NOTE: This sheet is a summary. It may not cover all possible information. If you have questions about this medicine, talk to your doctor, pharmacist, or health care provider.  2012, [employer]. (08/23/2008 2:28:07 PM)

## 2012-04-20 NOTE — Progress Notes (Signed)
General Surgery [facility]  Visit Diagnoses: 1. Substernal thyroid goiter     HISTORY: Patient is a 78 year old white male diagnosed with substernal thyroid goiter following aortic valve surgery. Patient has been followed with sequential ultrasound scanning. His most recent ultrasound was 02/10/2012. This showed an enlarged thyroid gland with the right lobe measuring 11 cm and left lobe measuring 9.2 cm respectively. The gland is in homogeneity but does not contain any discrete nodules. There was no evidence of malignancy. No lymphadenopathy was identified.  Patient is taking Synthroid 50 mcg daily. His last TSH level was normal at 2.8.  PERTINENT REVIEW OF SYSTEMS: Patient denies compressive symptoms. He denies dysphagia. He denies dyspnea. He denies globus sensation. He has had no voice changes.  EXAM: HEENT: normocephalic; pupils equal and reactive; sclerae clear; dentition good; mucous membranes moist NECK:  Palpable enlarged right thyroid lobe without tenderness; left lobe enlarged and extends beneath the clavicle; symmetric on extension; no palpable anterior or posterior cervical lymphadenopathy; no supraclavicular masses; no tenderness CHEST: clear to auscultation bilaterally without rales, rhonchi, or wheezes CARDIAC: regular rate and rhythm without significant murmur; peripheral pulses are full EXT:  non-tender without edema; no deformity NEURO: no gross focal deficits; no sign of tremor   IMPRESSION: #1 Substernal thyroid goiter, clinically stable #2 hypothyroidism  PLAN: Patient and his wife and I discussed the above results. I reviewed the ultrasound findings with them. I would like to keep him on Synthroid but will allow him to switch to a 90 day supply and used generic if desired in order to help with the cost.  Patient will have a repeat thyroid ultrasound in one year. I will see him back in the office following that study.  Velora Heckler, MD,  [facility] Office: [phone]

## 2012-06-10 ENCOUNTER — Other Ambulatory Visit: Payer: Self-pay | Admitting: Cardiovascular Disease

## 2012-06-12 ENCOUNTER — Other Ambulatory Visit: Payer: Self-pay | Admitting: *Deleted

## 2012-06-12 MED ORDER — METOPROLOL TARTRATE 12.5 MG HALF TABLET: 12.5000 mg | ORAL_TABLET | Freq: Two times a day (BID) | ORAL | Status: DC

## 2012-06-12 NOTE — Telephone Encounter (Signed)
Refilled Metoprolol

## 2012-06-13 ENCOUNTER — Other Ambulatory Visit: Payer: Self-pay | Admitting: Cardiovascular Disease

## 2012-06-13 NOTE — Telephone Encounter (Signed)
Refilled Metoprolol 25 mg tablet.

## 2012-07-09 ENCOUNTER — Other Ambulatory Visit: Payer: Self-pay | Admitting: Cardiovascular Disease

## 2012-07-10 ENCOUNTER — Other Ambulatory Visit: Payer: Self-pay | Admitting: Cardiovascular Disease

## 2012-07-27 ENCOUNTER — Ambulatory Visit (INDEPENDENT_AMBULATORY_CARE_PROVIDER_SITE_OTHER): Payer: Medicare Other | Admitting: Cardiovascular Disease

## 2012-07-27 ENCOUNTER — Encounter: Payer: Self-pay | Admitting: Cardiovascular Disease

## 2012-07-27 VITALS — BP 124/86 | HR 58 | Ht 66.0 in | Wt 179.5 lb

## 2012-07-27 NOTE — Patient Instructions (Addendum)
You are doing well. No medication changes were made.  We will schedule you for  an echocardiogram for mechanical valve  Please call us if you have new issues that need to be addressed before your next appt.  Your physician wants you to follow-up in: 12 months.  You will receive a reminder letter in the mail two months in advance. If you don't receive a letter, please call our office to schedule the follow-up appointment.

## 2012-07-27 NOTE — Assessment & Plan Note (Signed)
Continue aggressive cholesterol management 

## 2012-07-27 NOTE — Progress Notes (Signed)
Patient ID: Andre Evans, male    DOB: 1934/02/04, 79 y.o.   MRN: [id number]  HPI Comments: Andre Evans is a very pleasant 79 year old gentleman, patient of Dr. Burnett Sheng, with a history of hypertension and hypothyroidism, episodes of dizziness, echocardiogram showing severe aortic valve stenosis, now status post bioprosthetic valve placed June 08, 2011 by Dr. Dorris Fetch. He presents for routine followup.  He reports that he feels well, has minimal chest discomfort.  Overall his energy is good and he has no complaints.  Heart rate has been well-controlled in the 60s. He he is does appear he wonders taking lisinopril 20 mg today only. His wife does his medications for him. He continues to have mild chronic shortness of breath. No change. This has been stable. No other complaints  He reports having a carotid ultrasound that showed mild to moderate calcific plaque on the left with small plaque on the right. No significant stenoses.  Previous echocardiogram showed severe aortic valve stenosis with elevated velocity, aortic valve area less than 1 cm.    EKG shows normal sinus rhythm with rate of 59 beats a minute with nonspecific ST changes to the anterior precordial leads, ST and T wave abnormality in V6, one and aVL  Outpatient Encounter Prescriptions as of 07/27/2012  Medication Sig Dispense Refill  . amLODipine (NORVASC) 5 MG tablet Take 1 tablet (5 mg total) by mouth daily.  30 tablet  11  . Calcium-Magnesium-Zinc 1000-400-15 MG TABS Take 1 capsule by mouth daily.       . cholecalciferol (VITAMIN D) 1000 UNITS tablet Take 1,000 Units by mouth daily.        Marland Kitchen co-enzyme Q-10 30 MG capsule Take 30 mg by mouth daily.       Marland Kitchen levothyroxine (SYNTHROID) 50 MCG tablet Take 1 tablet (50 mcg total) by mouth daily.  90 tablet  3  . lisinopril (PRINIVIL,ZESTRIL) 20 MG tablet Take 20 mg by mouth daily.       . metoprolol tartrate (LOPRESSOR) 12.5 mg TABS Take 0.5 tablets (12.5 mg total) by mouth 2 (two)  times daily.  30 tablet  3  . Multiple Vitamins-Minerals (MULTIVITAMINS THER. W/MINERALS) TABS Take 1 tablet by mouth daily.       . Omega-3 Fatty Acids (MARINE LIPID CONCENTRATE) 240-360-5 MG-MG-UNIT CAPS Take by mouth daily.       Review of Systems  HENT: Negative.   Eyes: Negative.   Respiratory: Positive for shortness of breath.   Cardiovascular: Negative.   Gastrointestinal: Negative.   Musculoskeletal: Negative.        Leg cramps  Skin: Negative.   Psychiatric/Behavioral: Negative.   All other systems reviewed and are negative.    BP 124/86  Pulse 58  Ht 5\' 6"  (1.676 m)  Wt 179 lb 8 oz (81.421 kg)  BMI 28.99 kg/m2  Physical Exam  Nursing note and vitals reviewed. Constitutional: He is oriented to person, place, and time. He appears well-developed and well-nourished.  HENT:  Head: Normocephalic.  Nose: Nose normal.  Mouth/Throat: Oropharynx is clear and moist.  Eyes: Conjunctivae are normal. Pupils are equal, round, and reactive to light.  Neck: Normal range of motion. Neck supple. No JVD present. Carotid bruit is present.  Cardiovascular: Normal rate, regular rhythm, S1 normal, S2 normal and intact distal pulses.  Exam reveals no gallop and no friction rub.   Murmur heard.  Crescendo systolic murmur is present with a grade of 1/6  Pulmonary/Chest: Effort normal and breath sounds normal.  No respiratory distress. He has no wheezes. He has no rales. He exhibits no tenderness.  Abdominal: Soft. Bowel sounds are normal. He exhibits no distension. There is no tenderness.  Musculoskeletal: Normal range of motion. He exhibits no edema and no tenderness.  Lymphadenopathy:    He has no cervical adenopathy.  Neurological: He is alert and oriented to person, place, and time. Coordination normal.  Skin: Skin is warm and dry. No rash noted. No erythema.  Psychiatric: He has a normal mood and affect. His behavior is normal. Judgment and thought content normal.      Assessment  and Plan

## 2012-07-27 NOTE — Assessment & Plan Note (Signed)
Blood pressure is well controlled on today's visit. No changes made to the medications. 

## 2012-07-27 NOTE — Assessment & Plan Note (Signed)
We will schedule him for routine echocardiogram to look at his prosthetic aortic valve. He continues to have mild shortness of breath.

## 2012-07-27 NOTE — Assessment & Plan Note (Signed)
He reports having followup with Dr. Arlana Pouch in one month. If cholesterol not checked at that time, we can do it at a later date.

## 2012-07-28 ENCOUNTER — Other Ambulatory Visit: Payer: Self-pay

## 2012-07-28 ENCOUNTER — Telehealth: Payer: Self-pay

## 2012-07-28 MED ORDER — LISINOPRIL 20 MG PO TABS: 20.0000 mg | ORAL_TABLET | Freq: Two times a day (BID) | ORAL | Status: DC

## 2012-07-28 NOTE — Telephone Encounter (Signed)
Pt informed Understanding verb 

## 2012-07-28 NOTE — Telephone Encounter (Signed)
Message copied by Marcelle Overlie on Fri Jul 28, 2012 12:28 PM ------      Message from: Antonieta Iba      Created: Fri Jul 28, 2012 12:17 PM      Regarding: RE: MEDS       Blood pressure was good on lisinopril 20 mg twice a day      Would probably stay on the twice a day dose            If he is desperate to drop the dose to once a day,      He would need to monitor his blood pressure very carefully and keep the top number less than 135                  ----- Message -----         From: Marcelle Overlie, RN         Sent: 07/28/2012   8:16 AM           To: Antonieta Iba, MD      Subject: FW: MEDS                                                 See below      Pt told us wrong med dose when he was here to see you yesterday      ----- Message -----         From: Edman Circle         Sent: 07/27/2012   3:46 PM           To: Marcelle Overlie, RN      Subject: MEDS                                                     Pt came back into office after appt and wanted nurse to know that he is actually taking lisinopril 20mg  BID. Wants to know if he can reduce to 20 mg daily             ------

## 2012-08-08 ENCOUNTER — Other Ambulatory Visit: Payer: Self-pay | Admitting: Cardiovascular Disease

## 2012-08-08 NOTE — Telephone Encounter (Signed)
Refilled Lisinopril #60 Refill#5 sent to [facility].

## 2012-08-24 ENCOUNTER — Other Ambulatory Visit (INDEPENDENT_AMBULATORY_CARE_PROVIDER_SITE_OTHER): Payer: Medicare Other

## 2012-08-24 ENCOUNTER — Other Ambulatory Visit: Payer: Self-pay

## 2012-08-24 DIAGNOSIS — I359 Nonrheumatic aortic valve disorder, unspecified: Secondary | ICD-10-CM

## 2012-08-24 DIAGNOSIS — Z952 Presence of prosthetic heart valve: Secondary | ICD-10-CM

## 2012-08-24 DIAGNOSIS — I350 Nonrheumatic aortic (valve) stenosis: Secondary | ICD-10-CM

## 2012-10-08 ENCOUNTER — Other Ambulatory Visit: Payer: Self-pay | Admitting: Cardiovascular Disease

## 2012-11-09 ENCOUNTER — Other Ambulatory Visit: Payer: Self-pay | Admitting: Cardiovascular Disease

## 2012-11-10 ENCOUNTER — Other Ambulatory Visit: Payer: Self-pay | Admitting: *Deleted

## 2012-11-10 MED ORDER — METOPROLOL TARTRATE 12.5 MG HALF TABLET: 12.5000 mg | ORAL_TABLET | Freq: Two times a day (BID) | ORAL | Status: DC

## 2012-11-10 NOTE — Telephone Encounter (Signed)
Refilled Metoprolol sent to [facility]. 

## 2012-11-10 NOTE — Telephone Encounter (Signed)
Patient needs to make an appointment.

## 2013-03-06 ENCOUNTER — Other Ambulatory Visit: Payer: Self-pay | Admitting: *Deleted

## 2013-03-06 ENCOUNTER — Other Ambulatory Visit: Payer: Self-pay | Admitting: Cardiovascular Disease

## 2013-03-06 MED ORDER — LISINOPRIL 20 MG PO TABS: 20.0000 mg | ORAL_TABLET | Freq: Two times a day (BID) | ORAL | Status: DC

## 2013-03-06 NOTE — Telephone Encounter (Signed)
Refilled Lisinopril sent to [facility]. 

## 2013-03-16 ENCOUNTER — Encounter (INDEPENDENT_AMBULATORY_CARE_PROVIDER_SITE_OTHER): Payer: Self-pay | Admitting: Surgery

## 2013-04-20 ENCOUNTER — Ambulatory Visit
Admission: RE | Admit: 2013-04-20 | Discharge: 2013-04-20 | Disposition: A | Discharge status: Home / Self Care | Payer: Medicare Other | Source: Ambulatory Visit | Attending: Surgery | Admitting: Surgery

## 2013-04-20 DIAGNOSIS — E049 Nontoxic goiter, unspecified: Secondary | ICD-10-CM

## 2013-05-28 ENCOUNTER — Other Ambulatory Visit (INDEPENDENT_AMBULATORY_CARE_PROVIDER_SITE_OTHER): Payer: Self-pay

## 2013-05-28 DIAGNOSIS — E039 Hypothyroidism, unspecified: Secondary | ICD-10-CM

## 2013-05-28 MED ORDER — LEVOTHYROXINE SODIUM 50 MCG PO TABS: 50.0000 ug | ORAL_TABLET | Freq: Every day | ORAL | Status: DC

## 2013-07-25 ENCOUNTER — Other Ambulatory Visit: Payer: Self-pay

## 2013-07-25 MED ORDER — METOPROLOL TARTRATE 12.5 MG HALF TABLET: 12.5000 mg | ORAL_TABLET | Freq: Two times a day (BID) | ORAL | Status: DC

## 2013-07-25 NOTE — Telephone Encounter (Signed)
Refill sent for metoprolol tart 25 mg  

## 2013-07-27 ENCOUNTER — Other Ambulatory Visit: Payer: Self-pay | Admitting: *Deleted

## 2013-07-27 MED ORDER — METOPROLOL TARTRATE 12.5 MG HALF TABLET: 12.5000 mg | ORAL_TABLET | Freq: Two times a day (BID) | ORAL | Status: DC

## 2013-07-27 NOTE — Telephone Encounter (Signed)
Requested Prescriptions   Signed Prescriptions Disp Refills  . metoprolol tartrate (LOPRESSOR) 12.5 mg TABS tablet 30 tablet 3    Sig: Take 0.5 tablets (12.5 mg total) by mouth 2 (two) times daily.    Authorizing Provider: GOLLAN, TIMOTHY J    Ordering User: LOPEZ, MARINA C    

## 2013-08-30 ENCOUNTER — Ambulatory Visit (INDEPENDENT_AMBULATORY_CARE_PROVIDER_SITE_OTHER): Payer: Medicare Other | Admitting: Cardiovascular Disease

## 2013-08-30 ENCOUNTER — Encounter: Payer: Self-pay | Admitting: Cardiovascular Disease

## 2013-08-30 VITALS — BP 110/70 | HR 64 | Ht 66.0 in | Wt 185.8 lb

## 2013-08-30 DIAGNOSIS — I739 Peripheral vascular disease, unspecified: Secondary | ICD-10-CM

## 2013-08-30 DIAGNOSIS — E785 Hyperlipidemia, unspecified: Secondary | ICD-10-CM

## 2013-08-30 DIAGNOSIS — I350 Nonrheumatic aortic (valve) stenosis: Secondary | ICD-10-CM

## 2013-08-30 DIAGNOSIS — I359 Nonrheumatic aortic valve disorder, unspecified: Secondary | ICD-10-CM

## 2013-08-30 DIAGNOSIS — Z954 Presence of other heart-valve replacement: Secondary | ICD-10-CM

## 2013-08-30 DIAGNOSIS — Z952 Presence of prosthetic heart valve: Secondary | ICD-10-CM

## 2013-08-30 DIAGNOSIS — I10 Essential (primary) hypertension: Secondary | ICD-10-CM

## 2013-08-30 NOTE — Assessment & Plan Note (Signed)
Bioprosthetic valve January 2013. We'll repeat echocardiogram March 2016.

## 2013-08-30 NOTE — Assessment & Plan Note (Signed)
Blood pressure is well controlled on today's visit. No changes made to the medications. 

## 2013-08-30 NOTE — Assessment & Plan Note (Signed)
Mild to moderate carotid arterial disease.  repeat ultrasound in 2016

## 2013-08-30 NOTE — Assessment & Plan Note (Signed)
Cholesterol high given his mild to moderate carotid disease on the left. He does not want a statin

## 2013-08-30 NOTE — Progress Notes (Signed)
Patient ID: Andre Evans, male    DOB: 01-31-1934, 80 y.o.   MRN: [id number]  HPI Comments: [name] is a very pleasant 80 year old gentleman, patient of Dr. Burnett ShengHedrick, with a history of hypertension and hypothyroidism, episodes of dizziness, echocardiogram showing severe aortic valve stenosis, now status post bioprosthetic valve placed June 08, 2011 by Dr. Dorris FetchHendrickson. He presents for routine followup. Prior carotid ultrasound showing mild to moderate plaque on the left He has an enlarged thyroid  He reports that he feels well, has minimal chest discomfort.  Overall his energy is good and he has no complaints.  He continues to have mild chronic shortness of breath. He attributes this to deconditioning and mild obesity .No change. No other complaints   carotid ultrasound that showed mild to moderate calcific plaque on the left with small plaque on the right. No significant stenoses.  Previous echocardiogram showed severe aortic valve stenosis with elevated velocity, aortic valve area less than 1 cm.  Lab work showing total cholesterol 198, LDL 127 He does not want a statin   EKG shows normal sinus rhythm with rate of 64 beats a minute with no significant ST or T wave changes  Outpatient Encounter Prescriptions as of 08/30/2013  Medication Sig  . amLODipine (NORVASC) 5 MG tablet TAKE 1 TABLET BY MOUTH EVERY DAY  . Calcium-Magnesium-Zinc 1000-400-15 MG TABS Take 1 capsule by mouth daily.   . cholecalciferol (VITAMIN D) 1000 UNITS tablet Take 1,000 Units by mouth daily.    Marland Kitchen. co-enzyme Q-10 30 MG capsule Take 30 mg by mouth daily.   Marland Kitchen. levothyroxine (SYNTHROID) 50 MCG tablet Take 1 tablet (50 mcg total) by mouth daily.  Marland Kitchen. lisinopril (PRINIVIL,ZESTRIL) 20 MG tablet Take 1 tablet (20 mg total) by mouth 2 (two) times daily.  . metoprolol tartrate (LOPRESSOR) 12.5 mg TABS tablet Take 0.5 tablets (12.5 mg total) by mouth 2 (two) times daily.  . Multiple Vitamins-Minerals (MULTIVITAMINS THER.  W/MINERALS) TABS Take 1 tablet by mouth daily.   . Omega-3 Fatty Acids (MARINE LIPID CONCENTRATE) 240-360-5 MG-MG-UNIT CAPS Take by mouth daily.  . [DISCONTINUED] lisinopril (PRINIVIL,ZESTRIL) 20 MG tablet TAKE 1 TABLET BY MOUTH TWICE A DAY       Review of Systems  Constitutional: Negative.   HENT: Negative.   Eyes: Negative.   Respiratory: Positive for shortness of breath.   Cardiovascular: Negative.   Gastrointestinal: Negative.   Endocrine: Negative.   Musculoskeletal: Negative.        Leg cramps  Skin: Negative.   Allergic/Immunologic: Negative.   Neurological: Negative.   Hematological: Negative.   Psychiatric/Behavioral: Negative.   All other systems reviewed and are negative.    BP 110/70  Pulse 64  Ht 5\' 6"  (1.676 m)  Wt 185 lb 12 oz (84.256 kg)  BMI 30.00 kg/m2  Physical Exam  Nursing note and vitals reviewed. Constitutional: He is oriented to person, place, and time. He appears well-developed and well-nourished.  HENT:  Head: Normocephalic.  Nose: Nose normal.  Mouth/Throat: Oropharynx is clear and moist.  Eyes: Conjunctivae are normal. Pupils are equal, round, and reactive to light.  Neck: Normal range of motion. Neck supple. No JVD present. Carotid bruit is present.  Cardiovascular: Normal rate, regular rhythm, S1 normal, S2 normal and intact distal pulses.  Exam reveals no gallop and no friction rub.   Murmur heard.  Crescendo systolic murmur is present with a grade of 1/6  Pulmonary/Chest: Effort normal and breath sounds normal. No respiratory distress. He  has no wheezes. He has no rales. He exhibits no tenderness.  Abdominal: Soft. Bowel sounds are normal. He exhibits no distension. There is no tenderness.  Musculoskeletal: Normal range of motion. He exhibits no edema and no tenderness.  Lymphadenopathy:    He has no cervical adenopathy.  Neurological: He is alert and oriented to person, place, and time. Coordination normal.  Skin: Skin is warm and dry.  No rash noted. No erythema.  Psychiatric: He has a normal mood and affect. His behavior is normal. Judgment and thought content normal.      Assessment and Plan

## 2013-08-30 NOTE — Patient Instructions (Signed)
You are doing well. No medication changes were made.  Try RED YEAST RICE 1 to 4 pills a day  Echocardiogram 07/2014 for AVR  Please call us if you have new issues that need to be addressed before your next appt.  Your physician wants you to follow-up in: 12 months.  You will receive a reminder letter in the mail two months in advance. If you don't receive a letter, please call our office to schedule the follow-up appointment.

## 2013-09-27 ENCOUNTER — Telehealth (INDEPENDENT_AMBULATORY_CARE_PROVIDER_SITE_OTHER): Payer: Self-pay

## 2013-09-27 NOTE — Telephone Encounter (Signed)
I left msg with pts wife to have him call about upcoming appt. Pt was due for repeat thyroid u/s prior to his ov with Dr Gerrit FriendsGerkin. Pt to call back and let us know if he will keep appt or does he want us to sched u/s and r/s ov.

## 2013-09-27 NOTE — Telephone Encounter (Signed)
Patient called back and stated that he has not seen Dr Gerrit FriendsGerkin since his last u/s back on 04-20-2013. He want to keep his office with Dr Gerrit FriendsGerkin to go over the test that he had done back in 11/14

## 2013-10-02 ENCOUNTER — Encounter (INDEPENDENT_AMBULATORY_CARE_PROVIDER_SITE_OTHER): Payer: Self-pay | Admitting: Surgery

## 2013-10-02 ENCOUNTER — Other Ambulatory Visit (INDEPENDENT_AMBULATORY_CARE_PROVIDER_SITE_OTHER): Payer: Self-pay

## 2013-10-02 ENCOUNTER — Ambulatory Visit (INDEPENDENT_AMBULATORY_CARE_PROVIDER_SITE_OTHER): Payer: Medicare Other | Admitting: Surgery

## 2013-10-02 VITALS — BP 130/82 | HR 72 | Temp 98.4°F | Ht 66.0 in | Wt 184.0 lb

## 2013-10-02 DIAGNOSIS — E049 Nontoxic goiter, unspecified: Secondary | ICD-10-CM

## 2013-10-02 MED ORDER — LEVOTHYROXINE SODIUM 50 MCG PO TABS: 50.0000 ug | ORAL_TABLET | Freq: Every day | ORAL | Status: AC

## 2013-10-02 NOTE — Progress Notes (Signed)
General Surgery [facility]  Chief Complaint  Patient presents with  . Follow-up    substernal thyroid goiter    HISTORY: Patient is a 80 year old male followed for substernal thyroid goiter with thyroid nodules. It has been a year and a half since his last visit to this office. In the interim he did have laboratory studies in February 2015 at the office of his primary physician. TSH was normal at 2.388 on his current dose of levothyroxine 50 mcg daily.  Patient underwent an ultrasound examination in November 2014. This was compared to his prior study of September 2013. This shows an enlarged thyroid gland with the right lobe measuring 12.5 cm and left lobe measuring 8.1 cm. Nodules were largely unchanged. No worrisome findings were identified.  Patient presents today for followup on his substernal thyroid goiter and to refill his medications.  PERTINENT REVIEW OF SYSTEMS: Denies dysphagia. Denies shortness of breath. Denies tremor. Denies palpitations. Denies new masses. Denies discomfort in the neck.  EXAM: HEENT: normocephalic; pupils equal and reactive; sclerae clear; dentition good; mucous membranes moist NECK:  Enlarged thyroid bilaterally, greater on the right than on the left, extending beneath both clavicles; asymmetric on extension; no palpable anterior or posterior cervical lymphadenopathy; no supraclavicular masses; no tenderness CHEST: clear to auscultation bilaterally without rales, rhonchi, or wheezes CARDIAC: regular rate and rhythm without significant murmur; peripheral pulses are full EXT:  non-tender without edema; no deformity NEURO: no gross focal deficits; no sign of tremor   IMPRESSION: Substernal thyroid goiter, clinically stable, asymptomatic  PLAN: I discussed the above findings at length with the patient and his wife. He will remain on levothyroxine 50 mcg daily. His TSH is well within the normal range. Ultrasound shows no significant  change in his thyroid gland. Patient is asymptomatic. I see no reason to put him through the risk of surgery at this time.  Patient will return in one year. We will repeat a thyroid ultrasound and TSH level prior to that office visit.  Velora Hecklerodd M. Leimomi Zervas, MD, [facility] Office: [phone]  Visit Diagnoses: 1. Substernal thyroid goiter

## 2013-10-02 NOTE — Patient Instructions (Signed)
  CARE OF INCISION   Apply cocoa butter/vitamin E cream (Palmer's brand) to your incision 2 - 3 times daily.  Massage cream into incision for one minute with each application.  Use sunscreen (50 SPF or higher) for first 6 months after surgery if area is exposed to sun.  You may alternate Mederma or other scar reducing cream with cocoa butter cream if desired.       Edmonia Gonser M. Kyian Obst, MD, FACS      [facility]      Office: [phone]    

## 2013-10-04 ENCOUNTER — Other Ambulatory Visit: Payer: Self-pay | Admitting: Cardiovascular Disease

## 2013-10-23 IMAGING — CT CT CHEST W/ CM
3 of 4 series · 16 of 30 positions shown, 17 images · IV contrast (75CC OMNI 300)
Comparison: Multiple prior chest x-rays, most recently 06/11/2011.

CLINICAL DATA: Evaluate mediastinal widening seen on chest x-ray.
History of valve replacement on 06/08/2011.

CT CHEST WITH CONTRAST
TECHNIQUE: Multidetector CT imaging of the chest was performed
following the standard protocol during bolus administration of
intravenous contrast.
Contrast: 75mL OMNIPAQUE IOHEXOL 300 MG/ML IV SOLN

[Series 3: chest with · axial · 0.78mm/px · z∈[-232,-52]mm · 4 of 60 slices shown, 5 images]
[im 12/60  mediastinal]
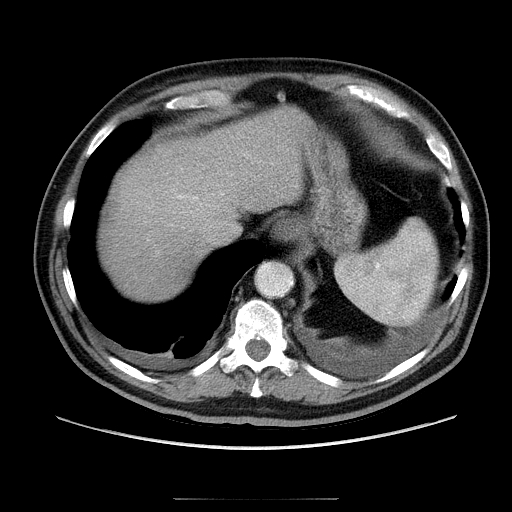
[im 12/60  lung]
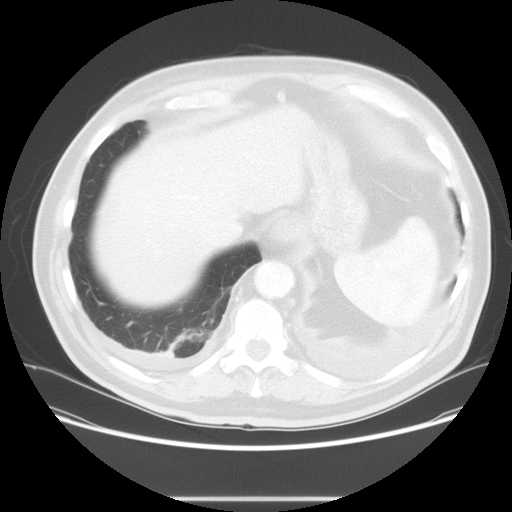
[im 24/60  lung]
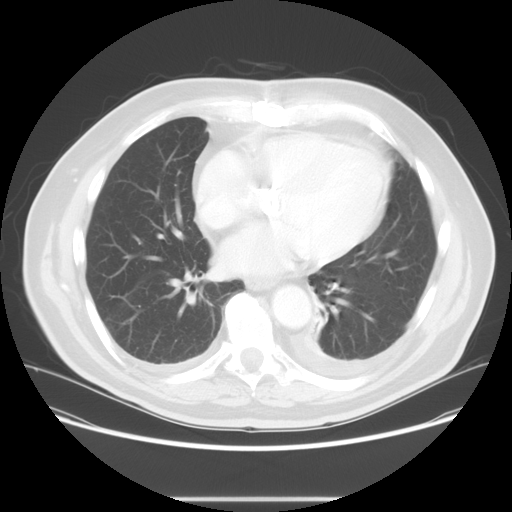
[im 36/60  lung]
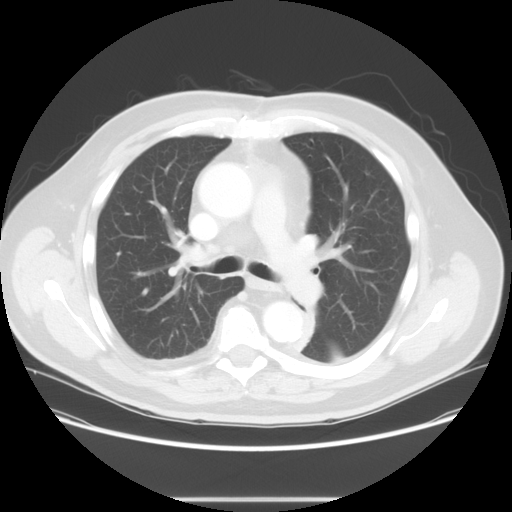
[im 48/60  lung]
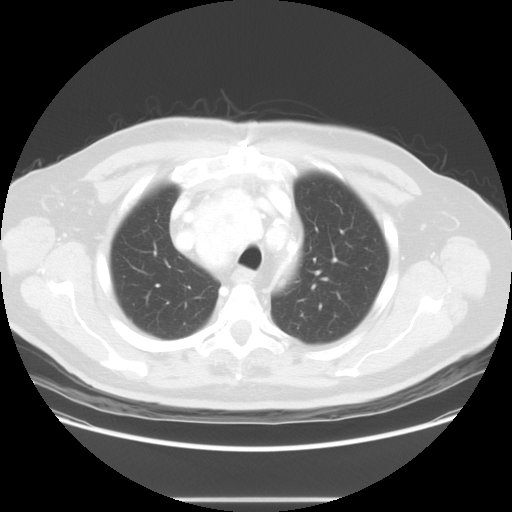

[Series 4: lung windows · axial · 0.78mm/px · z∈[-218,-52]mm · 4 of 57 slices shown]
[im 12/57  lung]
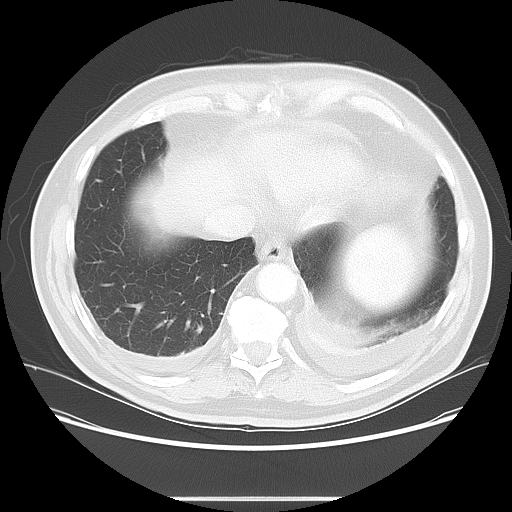
[im 23/57  lung]
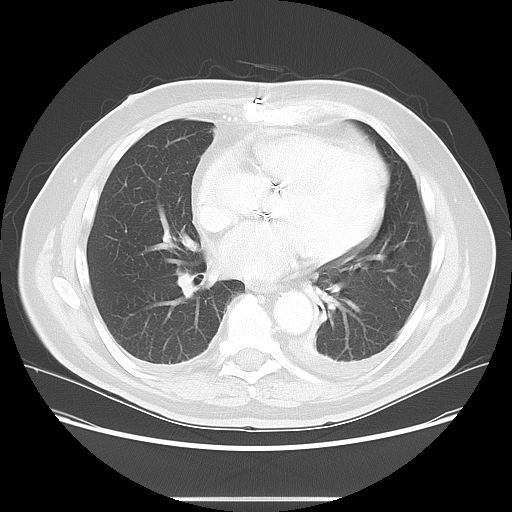
[im 34/57  lung]
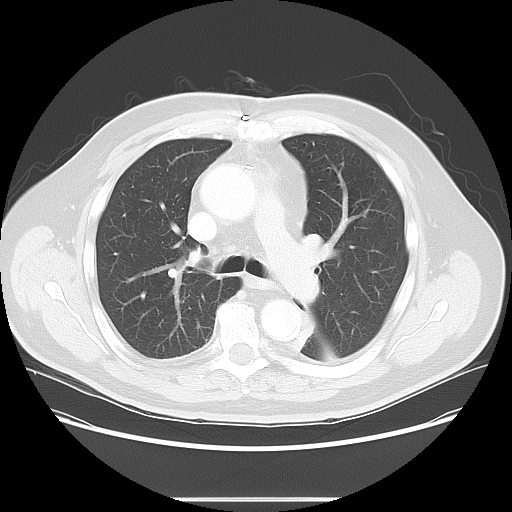
[im 45/57  lung]
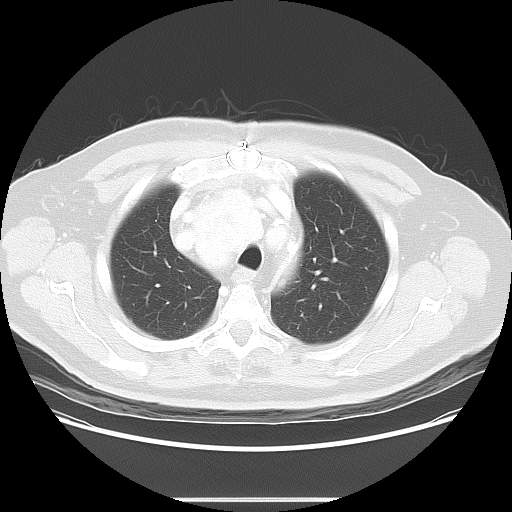

[Series 602: sagittal body · sagittal · 0.78mm/px · 8 of 161 slices shown]
[im 11/161  mediastinal]
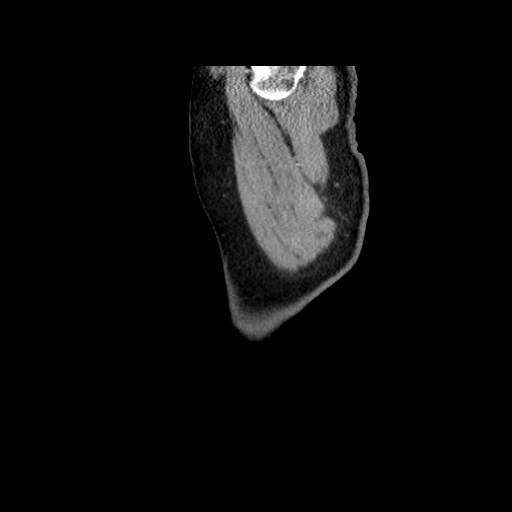
[im 33/161  mediastinal]
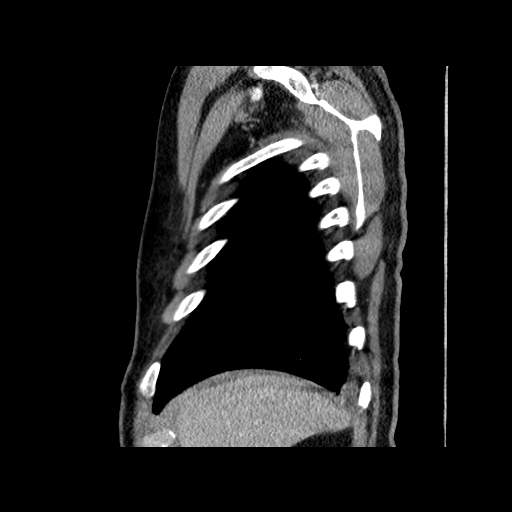
[im 54/161  mediastinal]
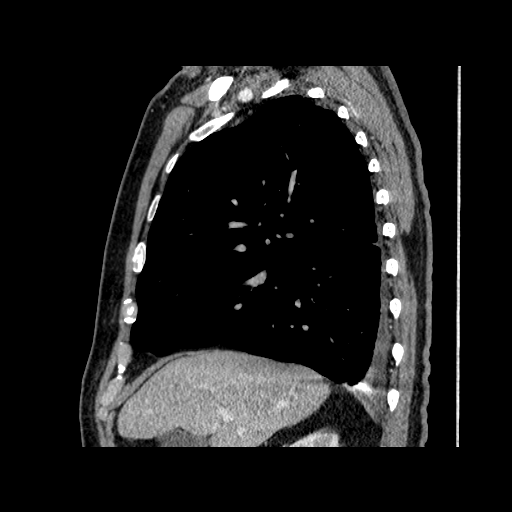
[im 75/161  mediastinal]
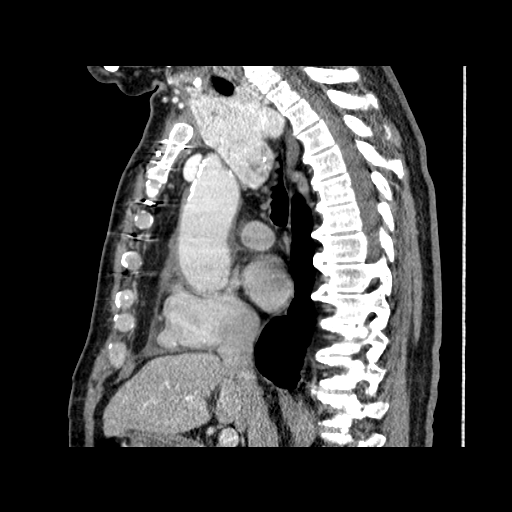
[im 86/161  mediastinal]
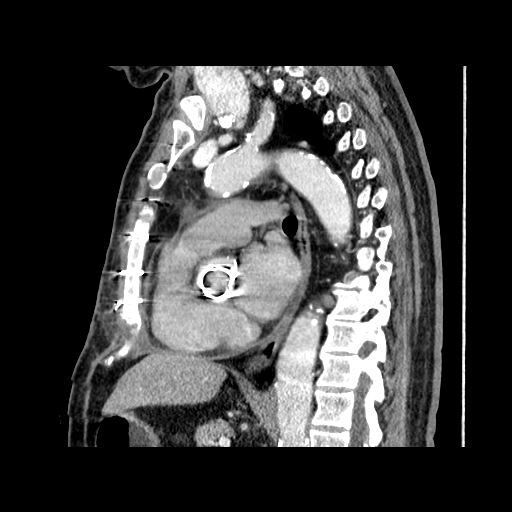
[im 107/161  mediastinal]
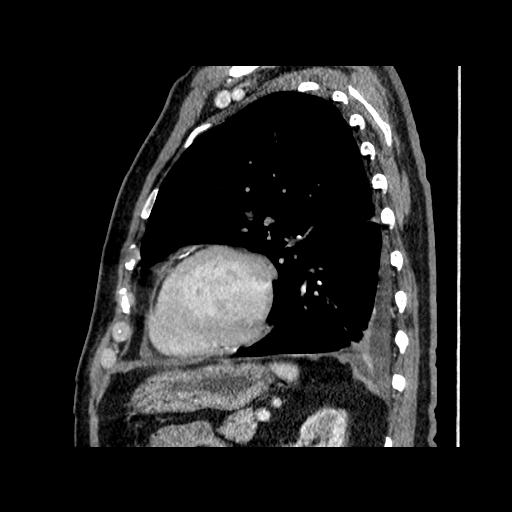
[im 129/161  mediastinal]
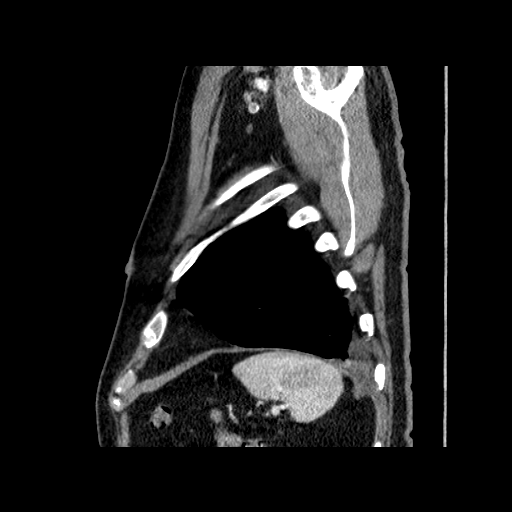
[im 150/161  mediastinal]
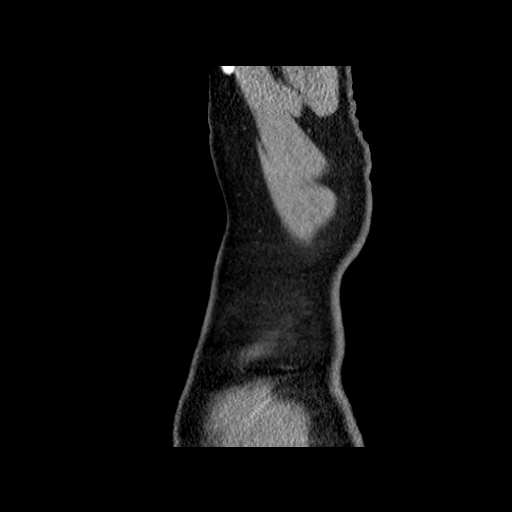

[16 of 30 positions shown; findings below may reference images not displayed]

FINDINGS: Mediastinum: Heart size is upper limits of normal. Trace amount of
pericardial fluid and/or thickening, unlikely to be of any
hemodynamic significance. There is atherosclerosis of the thoracic
aorta, the great vessels of the mediastinum and the coronary
arteries, including calcified atherosclerotic plaque in the left
main, left anterior descending, left circumflex and right coronary
arteries. Postoperative changes of the aortic valve replacement
(stented bioprosthesis).  Ectasia of the ascending thoracic aorta
(4.4 cm in diameter). No pathologically enlarged mediastinal or
hilar lymph nodes. Thyroid is diffusely heterogeneous with multiple
internal low attenuation regions and multi focal calcifications.
Most notably, the right lobe of the gland is massively enlarged (up
to 7.7 x 4.8 cm on axial images, and extends significantly into the
middle mediastinum (nearly to the level of the carina), resulting
in significant mass effect upon the trachea which is slightly
narrowed and pushed to the left.

Lungs/Pleura: Small bilateral pleural effusions layering
dependently.  Associated regions of passive atelectasis in the
lower lobes of the lungs bilaterally.  No consolidative airspace
disease.  No suspicious appearing pulmonary nodules or masses.

Upper Abdomen: Atherosclerosis.  Visualized portions of the upper
abdomen are otherwise unremarkable.

Musculoskeletal: There are no aggressive appearing lytic or blastic
lesions noted in the visualized portions of the skeleton. Median
sternotomy wires.
IMPRESSION: 1.  Mediastinal widening noted on recent chest radiographs is
related to massive enlargement of the thyroid gland, particularly
the right lobe of the gland which demonstrates significant
substernal extension.  While this is likely representative of a
goiter, given the very heterogeneous appearance of multiple
internal calcifications, underlying thyroid neoplasm would be
difficult to exclude.  Further evaluation with thyroid ultrasound
is recommended.
2.  Postoperative changes have of a recent median sternotomy for
aortic valve replacement (stented bioprosthesis in situ).
3. Atherosclerosis, including left main and three-vessel coronary
artery disease. Please note that although the presence of coronary
artery calcium documents the presence of coronary artery disease,
the severity of this disease and any potential stenosis cannot be
assessed on this non-gated CT examination.  Assessment for
potential risk factor modification, dietary therapy or
pharmacologic therapy may be warranted, if clinically indicated.
4.  Small bilateral pleural effusions with dependent areas of
passive atelectasis in the lower lobes lungs bilaterally.
5.  Ectasia of the ascending thoracic aorta (4.4 cm in diameter).

## 2014-04-02 ENCOUNTER — Other Ambulatory Visit: Payer: Self-pay | Admitting: *Deleted

## 2014-04-02 MED ORDER — METOPROLOL TARTRATE 12.5 MG HALF TABLET
12.5000 mg | ORAL_TABLET | Freq: Two times a day (BID) | ORAL | Status: DC
Start: 2014-04-02 — End: 2014-04-04

## 2014-04-02 NOTE — Telephone Encounter (Signed)
Requested Prescriptions   Signed Prescriptions Disp Refills  . metoprolol tartrate (LOPRESSOR) 12.5 mg TABS tablet 30 tablet 3    Sig: Take 0.5 tablets (12.5 mg total) by mouth 2 (two) times daily.    Authorizing Provider: Antonieta IbaGOLLAN, TIMOTHY J    Ordering User: Kendrick FriesLOPEZ, MARINA C

## 2014-04-04 ENCOUNTER — Other Ambulatory Visit: Payer: Self-pay | Admitting: *Deleted

## 2014-04-04 MED ORDER — METOPROLOL TARTRATE 25 MG PO TABS: 12.5000 mg | ORAL_TABLET | Freq: Two times a day (BID) | ORAL | Status: DC

## 2014-07-28 ENCOUNTER — Other Ambulatory Visit: Payer: Self-pay | Admitting: Cardiovascular Disease

## 2014-10-04 ENCOUNTER — Ambulatory Visit
Admission: RE | Admit: 2014-10-04 | Discharge: 2014-10-04 | Disposition: A | Discharge status: Home / Self Care | Payer: Medicare Other | Source: Ambulatory Visit | Attending: Surgery | Admitting: Surgery

## 2014-10-04 DIAGNOSIS — E049 Nontoxic goiter, unspecified: Secondary | ICD-10-CM | POA: Diagnosis not present

## 2014-10-09 ENCOUNTER — Other Ambulatory Visit: Payer: Self-pay | Admitting: Cardiovascular Disease

## 2014-10-10 DIAGNOSIS — E049 Nontoxic goiter, unspecified: Secondary | ICD-10-CM | POA: Diagnosis not present

## 2014-12-29 ENCOUNTER — Other Ambulatory Visit: Payer: Self-pay | Admitting: Cardiovascular Disease

## 2015-01-09 ENCOUNTER — Telehealth: Payer: Self-pay

## 2015-01-09 NOTE — Telephone Encounter (Signed)
l mom for pt to schedule past due follow up

## 2015-01-09 NOTE — Telephone Encounter (Signed)
-----   Message from Festus Aloe, CMA sent at 12/30/2014  9:03 AM EDT ----- Can you contact this patient for a follow up with Dr. Mariah Milling. He is past due. Thanks! Galen Manila

## 2015-01-15 NOTE — Telephone Encounter (Signed)
l mom to schedule past due f/u. Letter sent

## 2015-01-30 ENCOUNTER — Other Ambulatory Visit: Payer: Self-pay | Admitting: Cardiovascular Disease

## 2015-02-22 ENCOUNTER — Other Ambulatory Visit: Payer: Self-pay | Admitting: Cardiovascular Disease

## 2015-03-06 ENCOUNTER — Other Ambulatory Visit: Payer: Self-pay | Admitting: Cardiovascular Disease

## 2015-03-13 ENCOUNTER — Encounter: Payer: Self-pay | Admitting: Cardiovascular Disease

## 2015-03-13 ENCOUNTER — Ambulatory Visit (INDEPENDENT_AMBULATORY_CARE_PROVIDER_SITE_OTHER): Payer: Medicare Other | Admitting: Cardiovascular Disease

## 2015-03-13 ENCOUNTER — Telehealth: Payer: Self-pay

## 2015-03-13 VITALS — BP 188/96 | HR 58 | Ht 66.0 in | Wt 164.0 lb

## 2015-03-13 DIAGNOSIS — I739 Peripheral vascular disease, unspecified: Secondary | ICD-10-CM

## 2015-03-13 DIAGNOSIS — E785 Hyperlipidemia, unspecified: Secondary | ICD-10-CM

## 2015-03-13 DIAGNOSIS — Z952 Presence of prosthetic heart valve: Secondary | ICD-10-CM

## 2015-03-13 DIAGNOSIS — Z954 Presence of other heart-valve replacement: Secondary | ICD-10-CM

## 2015-03-13 DIAGNOSIS — I10 Essential (primary) hypertension: Secondary | ICD-10-CM | POA: Diagnosis not present

## 2015-03-13 DIAGNOSIS — I350 Nonrheumatic aortic (valve) stenosis: Secondary | ICD-10-CM | POA: Diagnosis not present

## 2015-03-13 MED ORDER — AMLODIPINE BESYLATE 10 MG PO TABS: 10.0000 mg | ORAL_TABLET | Freq: Every day | ORAL | Status: DC

## 2015-03-13 NOTE — Assessment & Plan Note (Signed)
Mild to moderate carotid arterial disease.  Stressed the importance of low cholesterol.

## 2015-03-13 NOTE — Telephone Encounter (Signed)
Pt is calling back with BP reading: 136/70

## 2015-03-13 NOTE — Telephone Encounter (Signed)
Would recommend that he bring blood pressure cuff in to be checked with our office or primary care with our manual cuff

## 2015-03-13 NOTE — Patient Instructions (Addendum)
You are doing well.  We will schedule an echocardiogram for AVR  Goal blood pressure is <140 on the top <90 on the bottom  Please increase the lisinopril up to 20 mg twice a day if blood pressure continues to run high If blood pressure continues to run high, Please increase the amlodipine up to 10 mg once a day  Please call mandi in the office to give her some blood pressure numbers  Please call us if you have new issues that need to be addressed before your next appt.  Your physician wants you to follow-up in: 6 months.  You will receive a reminder letter in the mail two months in advance. If you don't receive a letter, please call our office to schedule the follow-up appointment.  Echocardiogram An echocardiogram, or echocardiography, uses sound waves (ultrasound) to produce an image of your heart. The echocardiogram is simple, painless, obtained within a short period of time, and offers valuable information to your health care provider. The images from an echocardiogram can provide information such as:  Evidence of coronary artery disease (CAD).  Heart size.  Heart muscle function.  Heart valve function.  Aneurysm detection.  Evidence of a past heart attack.  Fluid buildup around the heart.  Heart muscle thickening.  Assess heart valve function. LET [facility]OUR [facility] CARE PROVIDER KNOW ABOUT:  Any allergies you have.  All medicines you are taking, including vitamins, herbs, eye drops, creams, and over-the-counter medicines.  Previous problems you or members of your family have had with the use of anesthetics.  Any blood disorders you have.  Previous surgeries you have had.  Medical conditions you have.  Possibility of pregnancy, if this applies. BEFORE THE PROCEDURE  No special preparation is needed. Eat and drink normally.  PROCEDURE   In order to produce an image of your heart, gel will be applied to your chest and a wand-like tool (transducer) will be moved  over your chest. The gel will help transmit the sound waves from the transducer. The sound waves will harmlessly bounce off your heart to allow the heart images to be captured in real-time motion. These images will then be recorded.  You may need an IV to receive a medicine that improves the quality of the pictures. AFTER THE PROCEDURE You may return to your normal schedule including diet, activities, and medicines, unless your health care provider tells you otherwise.   This information is not intended to replace advice given to you by your health care provider. Make sure you discuss any questions you have with your health care provider.   Document Released: 05/14/2000 Document Revised: 06/07/2014 Document Reviewed: 01/22/2013 Elsevier Interactive Patient Education [employer]2016 [employer]

## 2015-03-13 NOTE — Progress Notes (Signed)
Patient ID: Andre Evans, male    DOB: [birth year], 81 y.o.   MRN: [id number]  HPI Comments: [name] is a very pleasant 81 year old gentleman, patient of Dr. Burnett ShengHedrick, with a history of hypertension and hypothyroidism, episodes of dizziness, echocardiogram showing severe aortic valve stenosis, now status post bioprosthetic valve placed June 08, 2011 by Dr. Dorris FetchHendrickson. He presents for routine followup of his aortic valve Prior carotid ultrasound showing mild to moderate plaque on the left He has an enlarged thyroid  In follow-up today, he reports that he has lost significant weight. Since he lost his wife, he is not eating very much. Reports taking his blood pressure medications States his blood pressures typically well controlled at home. Initially reported systolic pressures of 160-170 on a regular basis Today in the office systolic pressures 180 up to 190. He cut back on lisinopril his own down to 20 MG daily from 40 mg daily Reports that he was rushing this morning and that is why his blood pressure is elevated No regular exercise program. Denies any chest pain In the past as had chronic shortness of breath. He attributes this to deconditioning and mild obesity .  EKG on today's visit shows normal sinus rhythm with rate 58 bpm, no significant ST or T-wave changes   other past medical history  carotid ultrasound that showed mild to moderate calcific plaque on the left with small plaque on the right. No significant stenoses.  Previous echocardiogram showed severe aortic valve stenosis with elevated velocity, aortic valve area less than 1 cm.  Lab work showing total cholesterol 198, LDL 127 He does not want a statin   No Known Allergies  Current Outpatient Prescriptions on File Prior to Visit  Medication Sig Dispense Refill  . amLODipine (NORVASC) 5 MG tablet TAKE 1 TABLET BY MOUTH EVERY DAY 30 tablet 0  . Calcium-Magnesium-Zinc 1000-400-15 MG TABS Take 1 capsule by mouth  daily.     . cholecalciferol (VITAMIN D) 1000 UNITS tablet Take 1,000 Units by mouth daily.      Marland Kitchen. levothyroxine (SYNTHROID) 50 MCG tablet Take 1 tablet (50 mcg total) by mouth daily. 90 tablet 3  . lisinopril (PRINIVIL,ZESTRIL) 20 MG tablet TAKE 1 TABLET BY MOUTH TWICE A DAY (Patient taking differently: TAKE 1 TABLET BY MOUTH DAILY) 60 tablet 1  . metoprolol tartrate (LOPRESSOR) 25 MG tablet TAKE 1/2 TABLET BY MOUTH TWICE A DAY 30 tablet 0  . Multiple Vitamins-Minerals (MULTIVITAMINS THER. W/MINERALS) TABS Take 1 tablet by mouth daily.     . Omega-3 Fatty Acids (MARINE LIPID CONCENTRATE) 240-360-5 MG-MG-UNIT CAPS Take by mouth daily.     No current facility-administered medications on file prior to visit.    Past Medical History  Diagnosis Date  . HLD (hyperlipidemia)     mixed  . Hemorrhoids   . History of cardiac murmur     with a leaky valve  . BPH (benign prostatic hyperplasia)   . Heart murmur     for years   . HTN (hypertension)     takes Lisinopril daily  . HTN (hypertension)   . Coronary artery disease   . Lightheadedness   . Dizziness   . Aortic stenosis   . Arthritis   . Heat rash     occasionally  . Constipation   . Hypothyroidism     thyroid goiter  . Urinary frequency   . Impaired hearing     Past Surgical History  Procedure Laterality Date  . Cardiac  catheterization  2012    done at [facility] by Dr.Galling  . Aortic valve replacement  06/08/2011    Procedure: AORTIC VALVE REPLACEMENT (AVR);  Surgeon: Loreli Slot, MD;  Location: [facility] OR;  Service: Open Heart Surgery;  Laterality: N/A;  . Cardiac valve replacement      Social History  reports that he has never smoked. He has never used smokeless tobacco. He reports that he does not drink alcohol or use illicit drugs.  Family History family history includes Coronary artery disease in an other family member; Diabetes in his father and another family member; Heart disease in his father and mother; Heart  failure in an other family member; Hypertension in his mother.     Review of Systems  Constitutional: Negative.   HENT: Negative.   Respiratory: Positive for shortness of breath.   Cardiovascular: Negative.   Gastrointestinal: Negative.   Musculoskeletal: Negative.        Leg cramps  Skin: Negative.   Neurological: Negative.   Hematological: Negative.   Psychiatric/Behavioral: Negative.   All other systems reviewed and are negative.   BP 188/96 mmHg  Pulse 58  Ht  (1.676 m)  Wt 164 lb (74.39 kg)  BMI 26.48 kg/m2  Physical Exam  Constitutional: He is oriented to person, place, and time. He appears well-developed and well-nourished.  HENT:  Head: Normocephalic.  Nose: Nose normal.  Mouth/Throat: Oropharynx is clear and moist.  Eyes: Conjunctivae are normal. Pupils are equal, round, and reactive to light.  Neck: Normal range of motion. Neck supple. No JVD present. Carotid bruit is present.  Cardiovascular: Normal rate, regular rhythm, S1 normal, S2 normal and intact distal pulses.  Exam reveals no gallop and no friction rub.   Murmur heard.  Crescendo systolic murmur is present with a grade of 1/6  Pulmonary/Chest: Effort normal and breath sounds normal. No respiratory distress. He has no wheezes. He has no rales. He exhibits no tenderness.  Abdominal: Soft. Bowel sounds are normal. He exhibits no distension. There is no tenderness.  Musculoskeletal: Normal range of motion. He exhibits no edema or tenderness.  Lymphadenopathy:    He has no cervical adenopathy.  Neurological: He is alert and oriented to person, place, and time. Coordination normal.  Skin: Skin is warm and dry. No rash noted. No erythema.  Psychiatric: He has a normal mood and affect. His behavior is normal. Judgment and thought content normal.      Assessment and Plan   Nursing note and vitals reviewed.

## 2015-03-13 NOTE — Assessment & Plan Note (Signed)
He does not want a statin Recent weight loss. No new lipid panel available

## 2015-03-13 NOTE — Assessment & Plan Note (Signed)
Repeat echocardiogram ordered to evaluate his prosthetic valve

## 2015-03-13 NOTE — Assessment & Plan Note (Signed)
Blood pressure is very elevated on today's visit, even on recheck This was explained to him in detail. Recommended he increase lisinopril back to 20 mg twice a day If blood pressure continues to run high, with increase amlodipine up to 10 mg daily  Recommended he call our office in the next several days with his blood pressure measurements

## 2015-03-14 NOTE — Telephone Encounter (Signed)
Spoke w/ pt.   Advised him of Dr. Windell HummingbirdGollan's recommendation.  He is agreeable and will call back if his BPs # go back up.

## 2015-04-08 ENCOUNTER — Other Ambulatory Visit: Payer: Self-pay

## 2015-04-08 ENCOUNTER — Ambulatory Visit (INDEPENDENT_AMBULATORY_CARE_PROVIDER_SITE_OTHER): Payer: Medicare Other

## 2015-04-08 DIAGNOSIS — Z954 Presence of other heart-valve replacement: Secondary | ICD-10-CM | POA: Diagnosis not present

## 2015-04-08 DIAGNOSIS — I10 Essential (primary) hypertension: Secondary | ICD-10-CM | POA: Diagnosis not present

## 2015-04-08 DIAGNOSIS — I350 Nonrheumatic aortic (valve) stenosis: Secondary | ICD-10-CM

## 2015-04-08 DIAGNOSIS — Z952 Presence of prosthetic heart valve: Secondary | ICD-10-CM

## 2015-04-13 ENCOUNTER — Other Ambulatory Visit: Payer: Self-pay | Admitting: Cardiovascular Disease

## 2015-04-22 ENCOUNTER — Other Ambulatory Visit: Payer: Self-pay | Admitting: Cardiovascular Disease

## 2015-10-31 ENCOUNTER — Telehealth: Payer: Self-pay | Admitting: Cardiovascular Disease

## 2015-10-31 NOTE — Telephone Encounter (Signed)
Scheduled patient for fu appt .  Patient wants to fast and have gollan order labs a this appt.  Patient has a thyroid dr. Claiborne Billingshat suggested he get tsh level  Lab while he is here as well.

## 2015-11-11 ENCOUNTER — Other Ambulatory Visit: Payer: Self-pay | Admitting: Cardiovascular Disease

## 2015-12-02 ENCOUNTER — Other Ambulatory Visit: Payer: Self-pay | Admitting: Cardiovascular Disease

## 2015-12-15 ENCOUNTER — Other Ambulatory Visit: Payer: Self-pay | Admitting: Cardiovascular Disease

## 2015-12-22 ENCOUNTER — Ambulatory Visit (INDEPENDENT_AMBULATORY_CARE_PROVIDER_SITE_OTHER): Payer: Medicare Other | Admitting: Cardiovascular Disease

## 2016-01-26 ENCOUNTER — Other Ambulatory Visit: Payer: Self-pay | Admitting: *Deleted

## 2016-01-26 MED ORDER — AMLODIPINE BESYLATE 5 MG PO TABS: 5.0000 mg | ORAL_TABLET | Freq: Every day | ORAL | 0 refills | Status: DC

## 2016-01-26 MED ORDER — AMLODIPINE BESYLATE 5 MG PO TABS: 5.0000 mg | ORAL_TABLET | Freq: Every day | ORAL | 3 refills | Status: DC

## 2016-02-03 ENCOUNTER — Ambulatory Visit (INDEPENDENT_AMBULATORY_CARE_PROVIDER_SITE_OTHER): Payer: Medicare Other | Admitting: Cardiovascular Disease

## 2016-02-03 ENCOUNTER — Encounter: Payer: Self-pay | Admitting: Cardiovascular Disease

## 2016-02-03 VITALS — BP 122/64 | HR 55 | Ht 66.0 in | Wt 161.5 lb

## 2016-02-03 DIAGNOSIS — Z954 Presence of other heart-valve replacement: Secondary | ICD-10-CM | POA: Diagnosis not present

## 2016-02-03 DIAGNOSIS — E785 Hyperlipidemia, unspecified: Secondary | ICD-10-CM

## 2016-02-03 DIAGNOSIS — I6523 Occlusion and stenosis of bilateral carotid arteries: Secondary | ICD-10-CM | POA: Diagnosis not present

## 2016-02-03 DIAGNOSIS — I10 Essential (primary) hypertension: Secondary | ICD-10-CM

## 2016-02-03 DIAGNOSIS — I350 Nonrheumatic aortic (valve) stenosis: Secondary | ICD-10-CM

## 2016-02-03 DIAGNOSIS — Z952 Presence of prosthetic heart valve: Secondary | ICD-10-CM

## 2016-02-03 DIAGNOSIS — I739 Peripheral vascular disease, unspecified: Secondary | ICD-10-CM | POA: Diagnosis not present

## 2016-02-03 NOTE — Progress Notes (Signed)
Cardiology Office Note  Date:  02/03/2016   ID:  Andre Evans, DOB August 17, 1933, MRN [id number]  PCP:  Jerl Mina, MD   Chief Complaint  Patient presents with  . Other    6 month f/u. Meds reviewed verbally with pt.    HPI:  Mr. Rendleman is a very pleasant 82 year old gentleman, patient of Dr. Burnett Sheng, with a history of hypertension and hypothyroidism, episodes of dizziness, echocardiogram showing severe aortic valve stenosis,  status post bioprosthetic valve placed June 08, 2011 by Dr. Dorris Fetch. He presents for routine followup of his aortic valve Prior carotid ultrasound showing mild to moderate plaque on the left He has an enlarged thyroid  In follow-up today, he reports blood pressure has been running low Heart rate in the mid 50s, he is concerned He cut back on his metoprolol down to 12.5 mg daily, He is taking lisinopril 20, not 20 mg twice a day Taking amlodipine 5 mg daily  Mild to moderate carotid dz on the left in 2012, no repeat carotid ultrasound since that time  Otherwise reports he feels well with no complaints, no regular exercise program No chest pain chronic shortness of breath. He attributes this to deconditioning and mild obesity .  EKG on today's visit shows normal sinus rhythm with rate 55 bpm, no significant ST or T-wave changes   other past medical history  carotid ultrasound that showed mild to moderate calcific plaque on the left with small plaque on the right. No significant stenoses.  Previous echocardiogram showed severe aortic valve stenosis with elevated velocity, aortic valve area less than 1 cm.  Lab work showing total cholesterol 198, LDL 127 He does not want a statin  PMH:   has a past medical history of Aortic stenosis; Arthritis; BPH (benign prostatic hyperplasia); Constipation; Coronary artery disease; Dizziness; Heart murmur; Heat rash; Hemorrhoids; History of cardiac murmur; HLD (hyperlipidemia); HTN (hypertension); HTN  (hypertension); Hypothyroidism; Impaired hearing; Lightheadedness; and Urinary frequency.  PSH:    Past Surgical History:  Procedure Laterality Date  . AORTIC VALVE REPLACEMENT  06/08/2011   Procedure: AORTIC VALVE REPLACEMENT (AVR);  Surgeon: Loreli Slot, MD;  Location: [facility] OR;  Service: Open Heart Surgery;  Laterality: N/A;  . CARDIAC CATHETERIZATION  2012   done at [facility] by Dr.Galling  . CARDIAC VALVE REPLACEMENT      Current Outpatient Prescriptions  Medication Sig Dispense Refill  . amLODipine (NORVASC) 5 MG tablet Take 1 tablet (5 mg total) by mouth daily. 90 tablet 3  . Calcium-Magnesium-Zinc 1000-400-15 MG TABS Take 1 capsule by mouth daily.     . cholecalciferol (VITAMIN D) 1000 UNITS tablet Take 1,000 Units by mouth daily.      Marland Kitchen levothyroxine (SYNTHROID) 50 MCG tablet Take 1 tablet (50 mcg total) by mouth daily. 90 tablet 3  . lisinopril (PRINIVIL,ZESTRIL) 20 MG tablet TAKE 1 TABLET BY MOUTH TWICE A DAY 60 tablet 11  . metoprolol tartrate (LOPRESSOR) 25 MG tablet TAKE 1/2 TABLET BY MOUTH TWICE A DAY 30 tablet 1  . Multiple Vitamins-Minerals (MULTIVITAMINS THER. W/MINERALS) TABS Take 1 tablet by mouth daily.     . Omega-3 Fatty Acids (MARINE LIPID CONCENTRATE) 240-360-5 MG-MG-UNIT CAPS Take by mouth daily.     No current facility-administered medications for this visit.      Allergies:   Review of patient's allergies indicates no known allergies.   Social History:  The patient  reports that he has never smoked. He has never used smokeless tobacco. He reports  that he does not drink alcohol or use drugs.   Family History:   family history includes Diabetes in his father; Heart disease in his father and mother; Hypertension in his mother.    Review of Systems: Review of Systems  Constitutional: Negative.   Respiratory: Negative.   Cardiovascular: Negative.   Gastrointestinal: Negative.   Musculoskeletal: Negative.   Neurological: Negative.    Psychiatric/Behavioral: Negative.   All other systems reviewed and are negative.    PHYSICAL EXAM: VS:  BP 122/64 (BP Location: Left Arm, Patient Position: Sitting, Cuff Size: Normal)   Pulse (!) 55   Ht 5\' 6"  (1.676 m)   Wt 161 lb 8 oz (73.3 kg)   BMI 26.07 kg/m  , BMI Body mass index is 26.07 kg/m. GEN: Well nourished, well developed, in no acute distress  HEENT: normal  Neck: no JVD, carotid bruits, or masses Cardiac: RRR; no murmurs, rubs, or gallops,no edema  Respiratory:  clear to auscultation bilaterally, normal work of breathing GI: soft, nontender, nondistended, + BS MS: no deformity or atrophy  Skin: warm and dry, no rash Neuro:  Strength and sensation are intact Psych: euthymic mood, full affect    Recent Labs: No results found for requested labs within last 8760 hours.    Lipid Panel Lab Results  Component Value Date   CHOL 185 02/04/2012   HDL 32.00 (L) 02/04/2012   LDLCALC 123 (H) 02/04/2012   TRIG 149.0 02/04/2012      Wt Readings from Last 3 Encounters:  02/03/16 161 lb 8 oz (73.3 kg)  03/13/15 164 lb (74.4 kg)  10/02/13 184 lb (83.5 kg)       ASSESSMENT AND PLAN:  Essential hypertension - Plan: EKG 12-Lead Blood pressure is well controlled on today's visit. He is concerned about bradycardia. We will hold the metoprolol  Hyperlipidemia Currently not on a statin. He has indicated he prefers not to be on any cholesterol medication  PVD (peripheral vascular disease) (HCC) We have ordered carotid ultrasound given mild to moderate carotid disease on the left in 2012  S/P AVR (aortic valve replacement) Last echocardiogram November 2016 Consider repeat echocardiogram next year  Long discussion concerning his pressure, carotid disease  Total encounter time more than 25 minutes  Greater than 50% was spent in counseling and coordination of care with the patient   Disposition:   F/U  12 months   Orders Placed This Encounter  Procedures   . EKG 12-Lead     Signed, Dossie Arbourim Gollan, M.D., Ph.D. 02/03/2016  [facility], [place] [phone]

## 2016-02-03 NOTE — Patient Instructions (Addendum)
Medication Instructions:   OK to hold the metoprolol Stay on amlodipine 5 mg once a day and lisinopril 20 mg once a day  Labwork:  Liver and lipid today  Testing/Procedures:  We will order a carotid ultrasound for carotid stenosis on the left in 2012  Follow-Up: It was a pleasure seeing you in the office today. Please call us if you have new issues that need to be addressed before your next appt.  [phone]  Your physician wants you to follow-up in: 12 months.  You will receive a reminder letter in the mail two months in advance. If you don't receive a letter, please call our office to schedule the follow-up appointment.  If you need a refill on your cardiac medications before your next appointment, please call your pharmacy.

## 2016-02-04 LAB — LIPID PANEL
CHOLESTEROL TOTAL: 195 mg/dL (ref 100–199)
Chol/HDL Ratio: 5.4 ratio units — ABNORMAL HIGH (ref 0.0–5.0)
HDL: 36 mg/dL — ABNORMAL LOW (ref >39)
LDL Calculated: 142 mg/dL — ABNORMAL HIGH (ref 0–99)
Triglycerides: 85 mg/dL (ref 0–149)
VLDL Cholesterol Cal: 17 mg/dL (ref 5–40)

## 2016-02-04 LAB — HEPATIC FUNCTION PANEL
ALK PHOS: 55 IU/L (ref 39–117)
ALT: 16 IU/L (ref 0–44)
AST: 14 IU/L (ref 0–40)
Albumin: 4 g/dL (ref 3.5–4.7)
BILIRUBIN, DIRECT: 0.13 mg/dL (ref 0.00–0.40)
Bilirubin Total: 0.6 mg/dL (ref 0.0–1.2)
Total Protein: 6.5 g/dL (ref 6.0–8.5)

## 2016-02-10 ENCOUNTER — Ambulatory Visit: Payer: Medicare Other

## 2016-02-10 DIAGNOSIS — I739 Peripheral vascular disease, unspecified: Secondary | ICD-10-CM

## 2016-02-10 DIAGNOSIS — I350 Nonrheumatic aortic (valve) stenosis: Secondary | ICD-10-CM

## 2016-02-10 DIAGNOSIS — Z952 Presence of prosthetic heart valve: Secondary | ICD-10-CM

## 2016-02-10 DIAGNOSIS — I6523 Occlusion and stenosis of bilateral carotid arteries: Secondary | ICD-10-CM

## 2016-02-10 DIAGNOSIS — E785 Hyperlipidemia, unspecified: Secondary | ICD-10-CM

## 2016-02-10 DIAGNOSIS — I10 Essential (primary) hypertension: Secondary | ICD-10-CM

## 2016-02-16 ENCOUNTER — Telehealth: Payer: Self-pay | Admitting: Cardiovascular Disease

## 2016-02-16 NOTE — Telephone Encounter (Signed)
Pt s/p AVR 2013.

## 2016-02-16 NOTE — Telephone Encounter (Signed)
Pt calling stating he is having some dental problems Going today this afternoon may need a extraction Would like to know if he needs to come by and pick up "pre med" in order to do this  Please advise

## 2016-02-18 NOTE — Telephone Encounter (Signed)
He will need preprocedure antibiotics for deep cleaning, tooth extraction Amoxicillin 2000 mg 1 morning of procedure

## 2016-02-19 NOTE — Telephone Encounter (Signed)
Spoke with patient and he states that the dentist ended up giving him a prescription for Amoxicillin 500 mg take 4 tablets 1 hour prior to his dental procedure. Let him know that Dr. Mariah MillingGollan had recommended same medication, dosage, and instructions and if we can assist any further to please give us a call back. He verbalized understanding and had no further questions at this time.

## 2016-05-13 ENCOUNTER — Other Ambulatory Visit: Payer: Self-pay | Admitting: Cardiovascular Disease

## 2016-05-15 ENCOUNTER — Other Ambulatory Visit: Payer: Self-pay | Admitting: Cardiovascular Disease

## 2016-06-05 ENCOUNTER — Other Ambulatory Visit: Payer: Self-pay | Admitting: Cardiovascular Disease

## 2016-09-07 ENCOUNTER — Other Ambulatory Visit: Payer: Self-pay | Admitting: Surgery

## 2016-09-07 DIAGNOSIS — E041 Nontoxic single thyroid nodule: Secondary | ICD-10-CM

## 2017-03-21 ENCOUNTER — Other Ambulatory Visit: Payer: Self-pay | Admitting: Cardiovascular Disease

## 2017-03-21 ENCOUNTER — Other Ambulatory Visit: Payer: Self-pay

## 2017-03-21 MED ORDER — LISINOPRIL 20 MG PO TABS: 20.0000 mg | ORAL_TABLET | Freq: Every day | ORAL | 0 refills | Status: DC

## 2017-03-21 NOTE — Telephone Encounter (Signed)
Patient requesting a refill on Lisinopril 20 mg which was changed at Dr. Windell HummingbirdGollan's last office visit to one tablet daily.

## 2017-03-24 ENCOUNTER — Telehealth: Payer: Self-pay | Admitting: Cardiovascular Disease

## 2017-03-24 NOTE — Telephone Encounter (Signed)
Pt is requesting a "full lab panel" before his appt with dr Mariah Millinggollan on 11/30. He also is requesting a "thyroid panel"

## 2017-03-24 NOTE — Telephone Encounter (Signed)
Spoke with patient and reviewed that when he comes in for appointment he could get labs done at that time if needed. He was agreeable with this plan and had no further questions at this time. Confirmed appointment and instructed him to call back if needed.

## 2017-04-24 ENCOUNTER — Other Ambulatory Visit: Payer: Self-pay | Admitting: Cardiovascular Disease

## 2017-04-28 NOTE — Progress Notes (Signed)
Cardiology Office Note  Date:  04/29/2017   ID:  Andre Evans, DOB [birth year], MRN [id number]  PCP:  Andre MinaHedrick, James, MD   Chief Complaint  Patient presents with  . other    12 month follow up. Meds reviewed by the pt. verbally. "doing well."     HPI:  [name] is a very pleasant 83 year old gentleman with a history of  hypertension  hypothyroidism,  episodes of dizziness,  echocardiogram showing severe aortic valve stenosis,   status post bioprosthetic valve placed June 08, 2011 by Dr. Dorris FetchHendrickson.  Prior carotid ultrasound showing mild to moderate plaque on the left He has an enlarged thyroid He presents for routine followup of his aortic valve  Reports that his blood pressure has been running high the past several weeks  recent stressors, he did not elaborate  Stopped amloidpine on his own, previously taking 5 mg daily Occasionally takes extra lisinopril but this does not seem to help his blood pressure Metoprolol previously held for bradycardia Heart rate continues to run low, he is asymptomatic  Denies any chest pain, shortness of breath with exertion, no PND orthopnea, no leg edema   Previous results discussed with him Carotid ultrasound September 2017 less than 39% bilateral disease   no regular exercise program  EKG on today's visit shows normal sinus rhythm with rate 52 bpm, no significant ST or T-wave changes   other past medical history  carotid ultrasound that showed mild to moderate calcific plaque on the left with small plaque on the right. No significant stenoses.  Previous echocardiogram showed severe aortic valve stenosis with elevated velocity, aortic valve area less than 1 cm.  Lab work showing total cholesterol 198, LDL 127 He does not want a statin  PMH:   has a past medical history of Aortic stenosis, Arthritis, BPH (benign prostatic hyperplasia), Constipation, Coronary artery disease, Dizziness, Heart murmur, Heat rash, Hemorrhoids,  History of cardiac murmur, HLD (hyperlipidemia), HTN (hypertension), HTN (hypertension), Hypothyroidism, Impaired hearing, Lightheadedness, and Urinary frequency.  PSH:    Past Surgical History:  Procedure Laterality Date  . AORTIC VALVE REPLACEMENT  06/08/2011   Procedure: AORTIC VALVE REPLACEMENT (AVR);  Surgeon: Loreli SlotSteven C Hendrickson, MD;  [facility];  Service: Open Heart Surgery;  Laterality: N/A;  . CARDIAC CATHETERIZATION  2012   [facility]  . CARDIAC VALVE REPLACEMENT      Current Outpatient Medications  Medication Sig Dispense Refill  . Calcium-Magnesium-Zinc 1000-400-15 MG TABS Take 1 capsule by mouth daily.     . cholecalciferol (VITAMIN D) 1000 UNITS tablet Take 1,000 Units by mouth daily.      Marland Kitchen. levothyroxine (SYNTHROID) 50 MCG tablet Take 1 tablet (50 mcg total) by mouth daily. 90 tablet 3  . lisinopril (PRINIVIL,ZESTRIL) 20 MG tablet Take 1 tablet (20 mg total) by mouth daily. 90 tablet 3  . Multiple Vitamins-Minerals (MULTIVITAMINS THER. W/MINERALS) TABS Take 1 tablet by mouth daily.     . Red Yeast Rice Extract (RED YEAST RICE PO) Take by mouth daily.    Marland Kitchen. amLODipine (NORVASC) 5 MG tablet Take 1 tablet (5 mg total) by mouth daily. 90 tablet 3   No current facility-administered medications for this visit.      Allergies:   Patient has no known allergies.   Social History:  The patient  reports that  has never smoked. he has never used smokeless tobacco. He reports that he does not drink alcohol or use drugs.   Family History:  family history includes Coronary artery disease in his unknown relative; Diabetes in his father and unknown relative; Heart disease in his father and mother; Heart failure in his unknown relative; Hypertension in his mother.    Review of Systems: Review of Systems  Constitutional: Negative.   Respiratory: Negative.   Cardiovascular: Negative.   Gastrointestinal: Negative.   Musculoskeletal: Negative.   Neurological:  Negative.   Psychiatric/Behavioral: Negative.   All other systems reviewed and are negative.    PHYSICAL EXAM: VS:  BP (!) 160/80 (BP Location: Left Arm, Patient Position: Sitting, Cuff Size: Normal)   Pulse (!) 52   Ht 5' 6.5" (1.689 m)   Wt 158 lb (71.7 kg)   BMI 25.12 kg/m  , BMI Body mass index is 25.12 kg/m. GEN: Well nourished, well developed, in no acute distress  HEENT: normal  Neck: no JVD, carotid bruits, or masses Cardiac: RRR; no murmurs, rubs, or gallops,no edema  Respiratory:  clear to auscultation bilaterally, normal work of breathing GI: soft, nontender, nondistended, + BS MS: no deformity or atrophy  Skin: warm and dry, no rash Neuro:  Strength and sensation are intact Psych: euthymic mood, full affect    Recent Labs: No results found for requested labs within last 8760 hours.    Lipid Panel Lab Results  Component Value Date   CHOL 195 02/03/2016   HDL 36 (L) 02/03/2016   LDLCALC 142 (H) 02/03/2016   TRIG 85 02/03/2016      Wt Readings from Last 3 Encounters:  04/29/17 158 lb (71.7 kg)  02/03/16 161 lb 8 oz (73.3 kg)  03/13/15 164 lb (74.4 kg)       ASSESSMENT AND PLAN:  Essential hypertension - Plan: EKG 12-Lead Recommended he restart amlodipine 5 mg daily with lisinopril 20 If blood pressure continues to run high we can increase amlodipine up to 10 or lisinopril up to 40 (or both). Recent stressors likely playing a role  Hyperlipidemia Currently not on a statin. He has indicated he prefers not to be on any cholesterol medication Discussed with him again today  PVD (peripheral vascular disease) (HCC) Mild carotid Disease carotid   S/P AVR (aortic valve replacement) Last echocardiogram November 2016 We have ordered repeat echocardiogram at his convenience to evaluate valve   Total encounter time more than 25 minutes  Greater than 50% was spent in counseling and coordination of care with the patient  Disposition:   F/U  12  months   Total encounter time more than 25 minutes  Greater than 50% was spent in counseling and coordination of care with the patient    Orders Placed This Encounter  Procedures  . EKG 12-Lead  . ECHOCARDIOGRAM COMPLETE     Signed, Dossie Arbourim Gollan, M.D., Ph.D. 04/29/2017  [facility], [place] [phone]

## 2017-04-29 ENCOUNTER — Ambulatory Visit (INDEPENDENT_AMBULATORY_CARE_PROVIDER_SITE_OTHER): Payer: Medicare Other | Admitting: Cardiovascular Disease

## 2017-04-29 ENCOUNTER — Encounter: Payer: Self-pay | Admitting: Cardiovascular Disease

## 2017-04-29 VITALS — BP 160/80 | HR 52 | Ht 66.5 in | Wt 158.0 lb

## 2017-04-29 DIAGNOSIS — I350 Nonrheumatic aortic (valve) stenosis: Secondary | ICD-10-CM | POA: Diagnosis not present

## 2017-04-29 DIAGNOSIS — Z7901 Long term (current) use of anticoagulants: Secondary | ICD-10-CM | POA: Diagnosis not present

## 2017-04-29 DIAGNOSIS — I739 Peripheral vascular disease, unspecified: Secondary | ICD-10-CM

## 2017-04-29 DIAGNOSIS — Z952 Presence of prosthetic heart valve: Secondary | ICD-10-CM

## 2017-04-29 DIAGNOSIS — I10 Essential (primary) hypertension: Secondary | ICD-10-CM | POA: Diagnosis not present

## 2017-04-29 DIAGNOSIS — E782 Mixed hyperlipidemia: Secondary | ICD-10-CM

## 2017-04-29 MED ORDER — LISINOPRIL 20 MG PO TABS: 20.0000 mg | ORAL_TABLET | Freq: Every day | ORAL | 3 refills | Status: DC

## 2017-04-29 MED ORDER — AMLODIPINE BESYLATE 5 MG PO TABS: 5.0000 mg | ORAL_TABLET | Freq: Every day | ORAL | 3 refills | Status: DC

## 2017-04-29 NOTE — Patient Instructions (Addendum)
Medication Instructions:   Please restart amlodipine 5 once a day with lisinopril 20 mg daily  If pressure continues to run high, You can take 2 of the amlodipine or 2 of the lisinopril  Labwork:  No new labs needed  Testing/Procedures:  We will order an echocardiogram for bioprosthetic aortic valve   Follow-Up: It was a pleasure seeing you in the office today. Please call us if you have new issues that need to be addressed before your next appt.  [phone]  Your physician wants you to follow-up in: 12 months.  You will receive a reminder letter in the mail two months in advance. If you don't receive a letter, please call our office to schedule the follow-up appointment.  If you need a refill on your cardiac medications before your next appointment, please call your pharmacy.

## 2017-06-01 ENCOUNTER — Ambulatory Visit (INDEPENDENT_AMBULATORY_CARE_PROVIDER_SITE_OTHER): Payer: Medicare Other

## 2017-06-01 ENCOUNTER — Other Ambulatory Visit: Payer: Self-pay

## 2017-06-01 DIAGNOSIS — I350 Nonrheumatic aortic (valve) stenosis: Secondary | ICD-10-CM | POA: Diagnosis not present

## 2017-06-01 DIAGNOSIS — Z952 Presence of prosthetic heart valve: Secondary | ICD-10-CM | POA: Diagnosis not present

## 2018-05-10 ENCOUNTER — Other Ambulatory Visit: Payer: Self-pay | Admitting: Cardiovascular Disease

## 2018-06-13 ENCOUNTER — Other Ambulatory Visit: Payer: Self-pay | Admitting: Cardiovascular Disease

## 2018-06-13 NOTE — Telephone Encounter (Signed)
Please schedule pt an appointment for further refills of lisinopril and amlodipine.

## 2018-06-27 ENCOUNTER — Ambulatory Visit: Payer: Medicare Other | Admitting: Nurse Practitioner

## 2018-06-28 ENCOUNTER — Encounter: Payer: Self-pay | Admitting: Nurse Practitioner

## 2018-07-20 ENCOUNTER — Ambulatory Visit: Payer: Medicare Other | Admitting: Nurse Practitioner

## 2018-07-21 ENCOUNTER — Other Ambulatory Visit: Payer: Self-pay

## 2018-07-21 MED ORDER — AMLODIPINE BESYLATE 5 MG PO TABS: 5.0000 mg | ORAL_TABLET | Freq: Every day | ORAL | 0 refills | Status: DC

## 2018-07-21 MED ORDER — LISINOPRIL 20 MG PO TABS: 20.0000 mg | ORAL_TABLET | Freq: Every day | ORAL | 0 refills | Status: DC

## 2018-07-28 ENCOUNTER — Other Ambulatory Visit: Payer: Self-pay | Admitting: Nurse Practitioner

## 2018-07-31 ENCOUNTER — Other Ambulatory Visit: Payer: Self-pay | Admitting: Nurse Practitioner

## 2018-08-01 NOTE — Telephone Encounter (Signed)
Patient medications can be refilled for 90 day supply after his 08/15/18 o/v.

## 2018-08-01 NOTE — Telephone Encounter (Signed)
Please review for 90 day supply LOV 2018.

## 2018-08-15 ENCOUNTER — Ambulatory Visit: Payer: Medicare Other | Admitting: Nurse Practitioner

## 2018-11-08 ENCOUNTER — Other Ambulatory Visit: Payer: Self-pay

## 2018-11-08 ENCOUNTER — Telehealth: Payer: Self-pay | Admitting: Nurse Practitioner

## 2018-11-08 ENCOUNTER — Telehealth (INDEPENDENT_AMBULATORY_CARE_PROVIDER_SITE_OTHER): Payer: Medicare Other | Admitting: Nurse Practitioner

## 2018-11-08 ENCOUNTER — Encounter: Payer: Self-pay | Admitting: Nurse Practitioner

## 2018-11-08 VITALS — BP 122/73 | HR 63 | Ht 66.0 in | Wt 150.0 lb

## 2018-11-08 DIAGNOSIS — E782 Mixed hyperlipidemia: Secondary | ICD-10-CM

## 2018-11-08 DIAGNOSIS — I77810 Thoracic aortic ectasia: Secondary | ICD-10-CM

## 2018-11-08 DIAGNOSIS — I10 Essential (primary) hypertension: Secondary | ICD-10-CM

## 2018-11-08 DIAGNOSIS — Z952 Presence of prosthetic heart valve: Secondary | ICD-10-CM | POA: Diagnosis not present

## 2018-11-08 MED ORDER — LISINOPRIL 20 MG PO TABS: 20.0000 mg | ORAL_TABLET | Freq: Every day | ORAL | 0 refills | Status: DC

## 2018-11-08 NOTE — Telephone Encounter (Signed)
Virtual Visit Pre-Appointment Phone Call  "(Name), I am calling you today to discuss your upcoming appointment. We are currently trying to limit exposure to the virus that causes COVID-19 by seeing patients at home rather than in the office."  1. "What is the BEST phone number to call the day of the visit?" - include this in appointment notes  2. "Do you have or have access to (through a family member/friend) a smartphone with video capability that we can use for your visit?" a. If yes - list this number in appt notes as "cell" (if different from BEST phone #) and list the appointment type as a VIDEO visit in appointment notes b. If no - list the appointment type as a PHONE visit in appointment notes  Confirm consent - "In the setting of the current Covid19 crisis, you are scheduled for a (phone or video) visit with your provider on (date) at (time).  Just as we do with many in-office visits, in order for you to participate in this visit, we must obtain consent.  If you'd like, I can send this to your mychart (if signed up) or email for you to review.  Otherwise, I can obtain your verbal consent now.  All virtual visits are billed to your insurance company just like a normal visit would be.  By agreeing to a virtual visit, we'd like you to understand that the technology does not allow for your provider to perform an examination, and thus may limit your provider's ability to fully assess your condition. If your provider identifies any concerns that need to be evaluated in person, we will make arrangements to do so.  Finally, though the technology is pretty good, we cannot assure that it will always work on either your or our end, and in the setting of a video visit, we may have to convert it to a phone-only visit.  In either situation, we cannot ensure that we have a secure connection.  Are you willing to proceed?" STAFF: Did the patient verbally acknowledge consent to telehealth visit? Document  YES/NO here: yes  3. Advise patient to be prepared - "Two hours prior to your appointment, go ahead and check your blood pressure, pulse, oxygen saturation, and your weight (if you have the equipment to check those) and write them all down. When your visit starts, your provider will ask you for this information. If you have an Apple Watch or Kardia device, please plan to have heart rate information ready on the day of your appointment. Please have a pen and paper handy nearby the day of the visit as well."  4. Give patient instructions for MyChart download to smartphone OR Doximity/Doxy.me as below if video visit (depending on what platform provider is using)  5. Inform patient they will receive a phone call 15 minutes prior to their appointment time (may be from unknown caller ID) so they should be prepared to answer    TELEPHONE CALL NOTE  Shirl Harrishilip G Meech has been deemed a candidate for a follow-up tele-health visit to limit community exposure during the Covid-19 pandemic. I spoke with the patient via phone to ensure availability of phone/video source, confirm preferred email & phone number, and discuss instructions and expectations.  I reminded Shirl Harrishilip G Ginzburg to be prepared with any vital sign and/or heart rhythm information that could potentially be obtained via home monitoring, at the time of his visit. I reminded Shirl Harrishilip G Walberg to expect a phone call prior to his  visit.  Lucienne Minks 11/08/2018 12:08 PM   INSTRUCTIONS FOR DOWNLOADING THE MYCHART APP TO SMARTPHONE  - The patient must first make sure to have activated MyChart and know their login information - If Apple, go to [employer] and type in MyChart in the search bar and download the app. If Android, ask patient to go to [employer] and type in [place] in the search bar and download the app. The app is free but as with any other app downloads, their phone may require them to verify saved payment information or Apple/Android  password.  - The patient will need to then log into the app with their MyChart username and password, and select [facility] as their healthcare provider to link the account. When it is time for your visit, go to the MyChart app, find appointments, and click Begin Video Visit. Be sure to Select Allow for your device to access the Microphone and Camera for your visit. You will then be connected, and your provider will be with you shortly.  **If they have any issues connecting, or need assistance please contact MyChart service desk [phone])**  **If using a computer, in order to ensure the best quality for their visit they will need to use either of the following Internet Browsers: [employer], or Google Chrome**  IF USING DOXIMITY or DOXY.ME - The patient will receive a link just prior to their visit by text.     FULL LENGTH CONSENT FOR TELE-HEALTH VISIT   I hereby voluntarily request, consent and authorize [facility] and its employed or contracted physicians, physician assistants, nurse practitioners or other licensed health care professionals (the Practitioner), to provide me with telemedicine health care services (the "Services") as deemed necessary by the treating Practitioner. I acknowledge and consent to receive the Services by the Practitioner via telemedicine. I understand that the telemedicine visit will involve communicating with the Practitioner through live audiovisual communication technology and the disclosure of certain medical information by electronic transmission. I acknowledge that I have been given the opportunity to request an in-person assessment or other available alternative prior to the telemedicine visit and am voluntarily participating in the telemedicine visit.  I understand that I have the right to withhold or withdraw my consent to the use of telemedicine in the course of my care at any time, without affecting my right to future care or treatment,  and that the Practitioner or I may terminate the telemedicine visit at any time. I understand that I have the right to inspect all information obtained and/or recorded in the course of the telemedicine visit and may receive copies of available information for a reasonable fee.  I understand that some of the potential risks of receiving the Services via telemedicine include:  Marland Kitchen Delay or interruption in medical evaluation due to technological equipment failure or disruption; . Information transmitted may not be sufficient (e.g. poor resolution of images) to allow for appropriate medical decision making by the Practitioner; and/or  . In rare instances, security protocols could fail, causing a breach of personal health information.  Furthermore, I acknowledge that it is my responsibility to provide information about my medical history, conditions and care that is complete and accurate to the best of my ability. I acknowledge that Practitioner's advice, recommendations, and/or decision may be based on factors not within their control, such as incomplete or inaccurate data provided by me or distortions of diagnostic images or specimens that may result from electronic transmissions. I understand that  the practice of medicine is not an exact science and that Practitioner makes no warranties or guarantees regarding treatment outcomes. I acknowledge that I will receive a copy of this consent concurrently upon execution via email to the email address I last provided but may also request a printed copy by calling the office of [facility].    I understand that my insurance will be billed for this visit.   I have read or had this consent read to me. . I understand the contents of this consent, which adequately explains the benefits and risks of the Services being provided via telemedicine.  . I have been provided ample opportunity to ask questions regarding this consent and the Services and have had my questions  answered to my satisfaction. . I give my informed consent for the services to be provided through the use of telemedicine in my medical care  By participating in this telemedicine visit I agree to the above.

## 2018-11-08 NOTE — Patient Instructions (Signed)
It was a pleasure to speak with you on the phone today! Thank you for allowing [place] to continue taking care of your [facility] needs during this time.   Feel free to call as needed for questions and concerns related to your cardiac needs.   Medication Instructions:  Your physician has recommended you make the following change in your medication:  1- STOP Amlodipine  If you need a refill on your cardiac medications before your next appointment, please call your pharmacy.   Lab work: 1- Your physician recommends that you return for lab work in: on the day of your Echo at [facility]. You will need to be fasting.  No appt is needed. Hours are M-F 7AM- 6 PM. (CBC, CMET, Lipid, TSH)   If you have labs (blood work) drawn today and your tests are completely normal, you will receive your results only by: Marland Kitchen MyChart Message (if you have MyChart) OR . A paper copy in the mail If you have any lab test that is abnormal or we need to change your treatment, we will call you to review the results.  Testing/Procedures: Echo  Please return to [facility] on ______________ at _____________am preferred since fasting for same day labs__ AM/PM for an Echocardiogram. Your physician has requested that you have an echocardiogram. Echocardiography is a painless test that uses sound waves to create images of your heart. It provides your doctor with information about the size and shape of your heart and how well your heart's chambers and valves are working. This procedure takes approximately one hour. There are no restrictions for this procedure. Please note; depending on visual quality an IV may need to be placed.    Follow-Up: At [facility], you and your health needs are our priority.  As part of our continuing mission to provide you with exceptional heart care, we have created designated Provider Care Teams.  These Care Teams include your primary Cardiologist (physician) and Advanced Practice  Providers (APPs -  Physician Assistants and Nurse Practitioners) who all work together to provide you with the care you need, when you need it. You will need a follow up appointment in 12 months.  Please call our office 2 months in advance to schedule this appointment.  You may see Ida Rogue, MD or Murray Hodgkins, NP.

## 2018-11-08 NOTE — Progress Notes (Signed)
Virtual Visit via Telephone Note   This visit type was conducted due to national recommendations for restrictions regarding the COVID-19 Pandemic (e.g. social distancing) in an effort to limit this patient's exposure and mitigate transmission in our community.  Due to his co-morbid illnesses, this patient is at least at moderate risk for complications without adequate follow up.  This format is felt to be most appropriate for this patient at this time.  The patient did not have access to video technology/had technical difficulties with video requiring transitioning to audio format only (telephone).  All issues noted in this document were discussed and addressed.  No physical exam could be performed with this format.  Please refer to the patient's chart for his  consent to telehealth for [facility]. Evaluation Performed:  Follow-up visit  This visit type was conducted due to national recommendations for restrictions regarding the COVID-19 Pandemic (e.g. social distancing).  This format is felt to be most appropriate for this patient at this time.  All issues noted in this document were discussed and addressed.  No physical exam was performed (except for noted visual exam findings with Video Visits).  Please refer to the patient's chart (MyChart message for video visits and phone note for telephone visits) for the patient's consent to telehealth for [facility]. _____________   Date:  11/08/2018   Patient ID:  Andre Evans, DOB 08-Mar-1934, MRN [id number] Patient Location:  Home Provider location:   Office  Primary Care Provider:  [place] Pink, MD Primary Cardiologist:  Ida Rogue, MD  Chief Complaint    85 y/o ? with a h/o Ao Stenosis s/p bioprosthetic AVR in 06/2011, HTN, HL, dilated Ao root, BPH, and hypothyroidism, who presents for f/u of heart disease.  Past Medical History    Past Medical History:  Diagnosis Date  . Aortic root dilatation ([place])    a. 05/2017 Echo: Asc Ao  4.5cm (4.3cm in 2013).  Marland Kitchen Aortic stenosis    a. 06/2011 s/p bioprosthetic AoV replacement; b. 05/2017 Echo: EF 60-65%, no rwma, Gr2 DD. Nl fxn'ing bioprosthetic AoV. Asc Ao 4.5cm. Mild to mod TR. Nl RV fxn. PASP 66mmHg.  . Arthritis   . BPH (benign prostatic hyperplasia)   . Carotid arterial disease ([place])   . Constipation   . Dizziness   . Essential hypertension   . Heart murmur    a. In setting of Ao Stenosis s/p AVR.  Marland Kitchen Heat rash    occasionally  . Hemorrhoids   . Hypothyroidism    thyroid goiter  . Impaired hearing   . Lightheadedness   . Mixed hyperlipidemia   . Non-obstructive Coronary artery disease    a. 05/2011 Cath: LM nl, LAD 30-40p/m, LCX min irregs, RCA small, nondominant, min irregs.  . Urinary frequency    Past Surgical History:  Procedure Laterality Date  . AORTIC VALVE REPLACEMENT  06/08/2011   Procedure: AORTIC VALVE REPLACEMENT (AVR);  Surgeon: Melrose Nakayama, MD;  Location: [facility];  Service: Open Heart Surgery;  Laterality: N/A;  . CARDIAC CATHETERIZATION  2012   done at [facility] by [place]  . CARDIAC VALVE REPLACEMENT      Allergies  No Known Allergies  History of Present Illness    Andre Evans is a 85 y.o. male who presents via audio/video conferencing for a telehealth visit today.  As above, he has a complex PMH including aortic stenosis s/p bioprosthetic AVR in 2013, nonobstructive CAD, HTN, HL, Ao root dilatation, BPH, and minimal  carotid dzs.  He was last seen in clinic in 03/2017.  Since then, he has been doing well.  He remains active around his house and in his yard and denies chest pain, palpitations, dyspnea, pnd, orthopnea, n, v, dizziness, syncope, edema, weight gain, or early satiety. He checks his bp regularly and notes that he typically runs in the 120-130 range, despite running out of amlodipine 2 mos ago.  He is careful to maintain social distancing when in public and does wear a mask.  The patient does not have symptoms concerning for  COVID-19 infection (fever, chills, cough, or new shortness of breath).   Home Medications    Prior to Admission medications   Medication Sig Start Date End Date Taking? Authorizing Provider  Calcium-Magnesium-Zinc 1000-400-15 MG TABS Take 1 capsule by mouth daily.    Yes [provider]  cholecalciferol (VITAMIN D) 1000 UNITS tablet Take 1,000 Units by mouth daily.     Yes [provider]  levothyroxine (SYNTHROID) 50 MCG tablet Take 1 tablet (50 mcg total) by mouth daily. 10/02/13  Yes Darnell LevelGerkin, Todd, MD  lisinopril (ZESTRIL) 20 MG tablet Take 1 tablet (20 mg total) by mouth daily. 11/08/18  Yes Creig HinesBerge, Lazarius Rivkin Ronald, NP  Multiple Vitamins-Minerals (MULTIVITAMINS THER. W/MINERALS) TABS Take 1 tablet by mouth daily.    Yes [provider]  Red Yeast Rice Extract (RED YEAST RICE PO) Take by mouth daily.   Yes [provider]    Review of Systems    He denies chest pain, palpitations, dyspnea, pnd, orthopnea, n, v, dizziness, syncope, edema, weight gain, or early satiety.  All other systems reviewed and are otherwise negative except as noted above.  Physical Exam    Vital Signs:  BP 122/73 (BP Location: Left Arm, Patient Position: Sitting, Cuff Size: Normal)   Pulse 63   Ht 5\' 6"  (1.676 m)   Wt 150 lb (68 kg)   BMI 24.21 kg/m    Phone visit - pleasant, NAD, AAOx3. Speech unlabored.  Accessory Clinical Findings    Cbc, cmet, lipids, tsh pending.  Assessment & Plan    1.  S/p Bioprosthetic AVR:  S/p AVR in 2013.  Stable valve by echo in 05/2017.  In setting of dilated Ao root, will f/u echo.  2.  Dilated Ascending Ao:  4.5 cm in 05/2017.  F/u echo.  3.  Essential HTN:  Stable on lisinopril therapy.  He ran out of amlodipine 2 mos ago and BP has continued in the 120-130 range.  Will not resume amlodipine at this time.   4.  HL:  LDL 142 in 2017.  He uses red yeast rice.  Repeat labs w/ low threshold to add statin (if tolerated) given h/o nonobs  CAD.  5.  Nonobs CAD:  No chest pain or dyspnea despite a fair amt of activity.  F/u lipids as above.  6.  Disposition:  F/u cbc, cmet, lipids, tsh (not checked since 2017).  F/u in echo.  F/u in clinic in 1 yr or sooner if necessary.  COVID-19 Education: The signs and symptoms of COVID-19 were discussed with the patient and how to seek care for testing (follow up with PCP or arrange E-visit).  The importance of social distancing was discussed today.  Patient Risk:   After full review of this patient's history and clinical status, I feel that he is at least moderate risk for cardiac complications at this time, thus necessitating a telehealth visit sooner than our first available  in office visit.  Time:   Today, I have spent 13 minutes with the patient with telehealth technology discussing medical history, symptoms, and management plan.     Nicolasa Duckinghristopher Trenten Watchman, NP 11/08/2018, 4:20 PM

## 2018-11-08 NOTE — Telephone Encounter (Signed)
*  STAT* If patient is at the pharmacy, call can be transferred to refill team.   1. Which medications need to be refilled? (please list name of each medication and dose if known) Lisinopril  2. Which pharmacy/location (including street and city if local pharmacy) is medication to be sent to?  [facility]  3. Do they need a 30 day or 90 day supply? [place]

## 2018-11-10 ENCOUNTER — Telehealth: Payer: Self-pay | Admitting: Nurse Practitioner

## 2018-11-10 NOTE — Telephone Encounter (Signed)
Patient would like his thyroid checked with the labwork he is going to have done the day of his echo. States his thyroid specialist in [place] is the one ordering the thyroid labwork. Patient states he will call back with this information.

## 2018-11-10 NOTE — Telephone Encounter (Signed)
Routed to [facility] NP's nurse Echo is scheduled for 12/06/18

## 2018-12-06 ENCOUNTER — Other Ambulatory Visit: Payer: Self-pay

## 2018-12-06 ENCOUNTER — Ambulatory Visit (INDEPENDENT_AMBULATORY_CARE_PROVIDER_SITE_OTHER): Payer: Medicare Other

## 2018-12-06 ENCOUNTER — Other Ambulatory Visit
Admission: RE | Admit: 2018-12-06 | Discharge: 2018-12-06 | Disposition: A | Discharge status: Home / Self Care | Payer: Medicare Other | Source: Ambulatory Visit | Attending: Nurse Practitioner | Admitting: Nurse Practitioner

## 2018-12-06 DIAGNOSIS — Z952 Presence of prosthetic heart valve: Secondary | ICD-10-CM

## 2018-12-06 DIAGNOSIS — I10 Essential (primary) hypertension: Secondary | ICD-10-CM | POA: Insufficient documentation

## 2018-12-06 LAB — COMPREHENSIVE METABOLIC PANEL
ALT: 13 U/L (ref 0–44)
AST: 15 U/L (ref 15–41)
Albumin: 4.2 g/dL (ref 3.5–5.0)
Alkaline Phosphatase: 51 U/L (ref 38–126)
Anion gap: 4 — ABNORMAL LOW (ref 5–15)
BUN: 19 mg/dL (ref 8–23)
CO2: 29 mmol/L (ref 22–32)
Calcium: 9.4 mg/dL (ref 8.9–10.3)
Chloride: 106 mmol/L (ref 98–111)
Creatinine, Ser: 0.84 mg/dL (ref 0.61–1.24)
GFR calc Af Amer: >60 mL/min (ref >60)
GFR calc non Af Amer: >60 mL/min (ref >60)
Glucose, Bld: 101 mg/dL — ABNORMAL HIGH (ref 70–99)
Potassium: 4.2 mmol/L (ref 3.5–5.1)
Sodium: 139 mmol/L (ref 135–145)
Total Bilirubin: 1.3 mg/dL — ABNORMAL HIGH (ref 0.3–1.2)
Total Protein: 6.9 g/dL (ref 6.5–8.1)

## 2018-12-06 LAB — LIPID PANEL
Cholesterol: 189 mg/dL (ref 0–200)
HDL: 31 mg/dL — ABNORMAL LOW (ref >40)
LDL Cholesterol: 139 mg/dL — ABNORMAL HIGH (ref 0–99)
Total CHOL/HDL Ratio: 6.1 RATIO
Triglycerides: 93 mg/dL (ref <150)
VLDL: 19 mg/dL (ref 0–40)

## 2018-12-06 LAB — CBC
HCT: 44.9 % (ref 39.0–52.0)
Hemoglobin: 14.9 g/dL (ref 13.0–17.0)
MCH: 29.3 pg (ref 26.0–34.0)
MCHC: 33.2 g/dL (ref 30.0–36.0)
MCV: 88.4 fL (ref 80.0–100.0)
Platelets: 167 10*3/uL (ref 150–400)
RBC: 5.08 MIL/uL (ref 4.22–5.81)
RDW: 12.6 % (ref 11.5–15.5)
WBC: 5.4 10*3/uL (ref 4.0–10.5)
nRBC: 0 % (ref 0.0–0.2)

## 2018-12-06 LAB — TSH: TSH: 3.516 u[IU]/mL (ref 0.350–4.500)

## 2018-12-08 ENCOUNTER — Telehealth: Payer: Self-pay

## 2018-12-08 NOTE — Telephone Encounter (Signed)
Pt made aware of lab results and Ignacia Bayley, NP recommendation. Pt sts that he has not ben taking the red yeast rice consistently for the last year.  He would rather go back to taking it daily rather then start a new medication. Adv the pt that I will fwd the update to [facility] and call back if he has additional recommendation. Pt verbalized understanding.

## 2018-12-08 NOTE — Telephone Encounter (Signed)
-----   Message from Theora Gianotti, NP sent at 12/06/2018 12:41 PM EDT ----- tsh and cbc wnl. Renal fxn/Lytes/lft's ok (total bili minimally elevated).  His LDL cholesterol is elevated @ 139.  He is currently using red yeast rice.  I would recommend that we switch to a statin (atorvastatin 40mg  daily), if he's willing.  If so, would f/u lipids/lft's in 6 wks.

## 2018-12-15 ENCOUNTER — Telehealth: Payer: Self-pay | Admitting: Nurse Practitioner

## 2018-12-15 NOTE — Telephone Encounter (Signed)
Notes recorded by Richmond Campbell, LPN on 4/59/9774 at 14:23 AM EDT  Lm to call back ./cy  ------   Notes recorded by Theora Gianotti, NP on 12/06/2018 at 12:38 PM EDT  Heart squeezing function is normal, though heart is mildly stiff (similar to what was seen before). Normal Ao valve prosthesis. Aortic root size is stable at 4.4 cm (prev 4.5 cm in 05/2017). Good report!

## 2018-12-15 NOTE — Telephone Encounter (Signed)
I spoke with the patient regarding his echo results.  He voices understanding.  

## 2018-12-15 NOTE — Telephone Encounter (Signed)
Attempted to call the patient. No answer- I left a message to please call back.  

## 2019-01-01 ENCOUNTER — Other Ambulatory Visit: Payer: Self-pay

## 2019-01-01 MED ORDER — LISINOPRIL 20 MG PO TABS: 20.0000 mg | ORAL_TABLET | Freq: Every day | ORAL | 3 refills | Status: DC

## 2020-01-25 ENCOUNTER — Ambulatory Visit: Payer: Self-pay | Attending: Internal Medicine

## 2020-01-25 DIAGNOSIS — Z23 Encounter for immunization: Secondary | ICD-10-CM

## 2020-01-25 NOTE — Progress Notes (Signed)
   Covid-19 Vaccination Clinic  Name:  Andre Evans    MRN: [id number] DOB: 10-07-1933  01/25/2020  Andre Evans was observed post Covid-19 immunization for 15 minutes without incident. He was provided with Vaccine Information Sheet and instruction to access the V-Safe system.   Andre Evans was instructed to call 911 with any severe reactions post vaccine: Marland Kitchen Difficulty breathing  . Swelling of face and throat  . A fast heartbeat  . A bad rash all over body  . Dizziness and weakness   Immunizations Administered    Name Date Dose VIS Date Route   Pfizer COVID-19 Vaccine 01/25/2020  8:49 AM 0.3 mL 07/25/2018 Intramuscular   Manufacturer: [employer], [employer]   Lot: [id number]   NDC: [address]-5

## 2020-02-11 ENCOUNTER — Other Ambulatory Visit: Payer: Self-pay

## 2020-02-11 MED ORDER — LISINOPRIL 20 MG PO TABS: 20.0000 mg | ORAL_TABLET | Freq: Every day | ORAL | 0 refills | Status: DC

## 2020-02-12 ENCOUNTER — Telehealth: Payer: Self-pay

## 2020-02-12 NOTE — Telephone Encounter (Signed)
Attempted to schedule.  LMOV to call office.  ° °

## 2020-02-12 NOTE — Telephone Encounter (Signed)
-----   Message from Festus Aloe, CMA sent at 02/11/2020  4:42 PM EDT ----- Please contact patient for a follow up appointment.  The patient was to follow up in June 2021.  Thanks, Jasmine December

## 2020-02-15 ENCOUNTER — Ambulatory Visit: Payer: Medicare Other | Attending: Internal Medicine

## 2020-02-15 DIAGNOSIS — Z23 Encounter for immunization: Secondary | ICD-10-CM

## 2020-02-15 NOTE — Progress Notes (Signed)
   Covid-19 Vaccination Clinic  Name:  MALOSI HEMSTREET    MRN: [id number] DOB: 12/23/1933  02/15/2020  Mr. Cappella was observed post Covid-19 immunization for 15 minutes without incident. He was provided with Vaccine Information Sheet and instruction to access the V-Safe system.   Mr. Rodeheaver was instructed to call 911 with any severe reactions post vaccine: Marland Kitchen Difficulty breathing  . Swelling of face and throat  . A fast heartbeat  . A bad rash all over body  . Dizziness and weakness   Immunizations Administered    Name Date Dose VIS Date Route   Pfizer COVID-19 Vaccine 02/15/2020  9:54 AM 0.3 mL 07/25/2018 Intramuscular   Manufacturer: [employer], [employer]   Lot: [id number]   NDC: [address]-8

## 2020-04-07 ENCOUNTER — Telehealth: Payer: Self-pay | Admitting: Cardiovascular Disease

## 2020-04-07 NOTE — Telephone Encounter (Signed)
°  Patient Consent for Virtual Visit         Andre Evans has provided verbal consent on 04/07/2020 for a virtual visit (video or telephone).   CONSENT FOR VIRTUAL VISIT FOR:  Andre Evans  By participating in this virtual visit I agree to the following:  I hereby voluntarily request, consent and authorize [facility] and its employed or contracted physicians, physician assistants, nurse practitioners or other licensed health care professionals (the Practitioner), to provide me with telemedicine health care services (the Services") as deemed necessary by the treating Practitioner. I acknowledge and consent to receive the Services by the Practitioner via telemedicine. I understand that the telemedicine visit will involve communicating with the Practitioner through live audiovisual communication technology and the disclosure of certain medical information by electronic transmission. I acknowledge that I have been given the opportunity to request an in-person assessment or other available alternative prior to the telemedicine visit and am voluntarily participating in the telemedicine visit.  I understand that I have the right to withhold or withdraw my consent to the use of telemedicine in the course of my care at any time, without affecting my right to future care or treatment, and that the Practitioner or I may terminate the telemedicine visit at any time. I understand that I have the right to inspect all information obtained and/or recorded in the course of the telemedicine visit and may receive copies of available information for a reasonable fee.  I understand that some of the potential risks of receiving the Services via telemedicine include:   Delay or interruption in medical evaluation due to technological equipment failure or disruption;  Information transmitted may not be sufficient (e.g. poor resolution of images) to allow for appropriate medical decision making by the Practitioner;  and/or   In rare instances, security protocols could fail, causing a breach of personal health information.  Furthermore, I acknowledge that it is my responsibility to provide information about my medical history, conditions and care that is complete and accurate to the best of my ability. I acknowledge that Practitioner's advice, recommendations, and/or decision may be based on factors not within their control, such as incomplete or inaccurate data provided by me or distortions of diagnostic images or specimens that may result from electronic transmissions. I understand that the practice of medicine is not an exact science and that Practitioner makes no warranties or guarantees regarding treatment outcomes. I acknowledge that a copy of this consent can be made available to me via my patient portal [facility] MyChart), or I can request a printed copy by calling the office of [facility].    I understand that my insurance will be billed for this visit.   I have read or had this consent read to me.  I understand the contents of this consent, which adequately explains the benefits and risks of the Services being provided via telemedicine.   I have been provided ample opportunity to ask questions regarding this consent and the Services and have had my questions answered to my satisfaction.  I give my informed consent for the services to be provided through the use of telemedicine in my medical care

## 2020-04-08 NOTE — Progress Notes (Signed)
Virtual Visit via Telephone Note   This visit type was conducted due to national recommendations for restrictions regarding the COVID-19 Pandemic (e.g. social distancing) in an effort to limit this patient's exposure and mitigate transmission in our community.  Due to his co-morbid illnesses, this patient is at least at moderate risk for complications without adequate follow up.  This format is felt to be most appropriate for this patient at this time.  The patient did not have access to video technology/had technical difficulties with video requiring transitioning to audio format only (telephone).  All issues noted in this document were discussed and addressed.  No physical exam could be performed with this format.  Please refer to the patient's chart for his  consent to telehealth for [facility].   I connected with  Andre Evans on 04/09/20 by a video enabled telemedicine application and verified that I am speaking with the correct person using two identifiers. I am contacting the patient above from our cardiology clinic office or alternate office work station to their home, I discussed the limitations of evaluation and management by telemedicine. The patient expressed understanding and agreed to proceed.   Evaluation Performed:  Follow-up visit  Date:  04/09/2020   ID:  Andre Evans, DOB 1934-03-02, MRN [id number]  Patient Location:  [address] [place] [address] [place] [address]   Provider location:   [facility], [place] office  PCP:  Jerl Mina, MD  Cardiologist:  [facility]bard [facility]   Chief Complaint  Patient presents with  . Other    12 month follow up. Meds reviewed verally with patient.     History of Present Illness:    Andre Evans is a 86 y.o. male who presents via audio/video conferencing for a telehealth visit today.   The patient does not symptoms concerning for COVID-19 infection (fever, chills, cough, or new SHORTNESS OF BREATH).   history of    hypertension  hypothyroidism,  episodes of dizziness,  echocardiogram showing severe aortic valve stenosis,   status post bioprosthetic valve placed June 08, 2011 by Dr. Dorris Fetch.  Prior carotid ultrasound showing mild to moderate plaque on the left He has an enlarged thyroid He presents for routine followup of his aortic valve, HTN, AVR  Previously with leg cramps, Starting taking electrolyte solution from drug store Helped his symptoms  BP "well controlled at home" Does not have any particular numbers to share apart from the blood pressure numbers as detailed below  Has neuropathy, legs No regular exercise  Total chol 189, LDL 139 Previously discussed with him, he does not want a statin  Denies any chest pain, shortness of breath with exertion, no PND orthopnea, no leg edema   Not taking thyroid medication, has not seen PMD or specialist for labs Does not think that he needs it  On prior clinic visit reported that he had stopped amlodipine on his own, was taking extra lisinopril for blood pressure, Metoprolol previously held for bradycardia  Other past medical history reviewed Carotid ultrasound September 2017 less than 39% bilateral disease mild to moderate calcific plaque on the left with small plaque on the right. No significant stenoses.  Previous echocardiogram showed severe aortic valve stenosis with elevated velocity, aortic valve area less than 1 cm.      Past Medical History:  Diagnosis Date  . Aortic root dilatation (HCC)    a. 05/2017 Echo: Asc Ao 4.5cm (4.3cm in 2013).  Marland Kitchen Aortic stenosis  a. 06/2011 s/p bioprosthetic AoV replacement; b. 05/2017 Echo: EF 60-65%, no rwma, Gr2 DD. Nl fxn'ing bioprosthetic AoV. Asc Ao 4.5cm. Mild to mod TR. Nl RV fxn. PASP .  . Arthritis   . BPH (benign prostatic hyperplasia)   . Carotid arterial disease (HCC)   . Constipation   . Dizziness   . Essential hypertension   . Heart murmur    a. In setting of Ao  Stenosis s/p AVR.  Marland Kitchen Heat rash    occasionally  . Hemorrhoids   . Hypothyroidism    thyroid goiter  . Impaired hearing   . Lightheadedness   . Mixed hyperlipidemia   . Non-obstructive Coronary artery disease    a. 05/2011 Cath: LM nl, LAD 30-40p/m, LCX min irregs, RCA small, nondominant, min irregs.  . Urinary frequency    Past Surgical History:  Procedure Laterality Date  . AORTIC VALVE REPLACEMENT  06/08/2011   Procedure: AORTIC VALVE REPLACEMENT (AVR);  Surgeon: Loreli Slot, MD;  Location: [facility] OR;  Service: Open Heart Surgery;  Laterality: N/A;  . CARDIAC CATHETERIZATION  2012   done at [facility] by Dr.Galling  . CARDIAC VALVE REPLACEMENT        Allergies:   Patient has no known allergies.   Social History   Tobacco Use  . Smoking status: Never Smoker  . Smokeless tobacco: Never Used  . Tobacco comment: tobacco use - no  Substance Use Topics  . Alcohol use: No  . Drug use: No     Current Outpatient Medications on File Prior to Visit  Medication Sig Dispense Refill  . Calcium-Magnesium-Zinc 1000-400-15 MG TABS Take 1 capsule by mouth daily.     . cholecalciferol (VITAMIN D) 1000 UNITS tablet Take 1,000 Units by mouth daily.      Marland Kitchen lisinopril (ZESTRIL) 20 MG tablet Take 1 tablet (20 mg total) by mouth daily. 30 tablet 0  . Multiple Vitamins-Minerals (MULTIVITAMINS THER. W/MINERALS) TABS Take 1 tablet by mouth daily.     . Red Yeast Rice Extract (RED YEAST RICE PO) Take by mouth daily.    Marland Kitchen levothyroxine (SYNTHROID) 50 MCG tablet Take 1 tablet (50 mcg total) by mouth daily. (Patient not taking: Reported on 04/09/2020) 90 tablet 3   No current facility-administered medications on file prior to visit.     Family Hx: The patient's family history includes Coronary artery disease in an other family member; Diabetes in his father and another family member; Heart disease in his father and mother; Heart failure in an other family member; Hypertension in his mother.  ROS:     Please see the history of present illness.    Review of Systems  Constitutional: Negative.   HENT: Negative.   Respiratory: Negative.   Cardiovascular: Negative.   Gastrointestinal: Negative.   Musculoskeletal: Negative.   Neurological: Negative.   Psychiatric/Behavioral: Negative.   All other systems reviewed and are negative.    Labs/Other Tests and Data Reviewed:    Recent Labs: No results found for requested labs within last 8760 hours.   Recent Lipid Panel Lab Results  Component Value Date/Time   CHOL 189 12/06/2018 10:26 AM   CHOL 195 02/03/2016 11:45 AM   TRIG 93 12/06/2018 10:26 AM   HDL 31 (L) 12/06/2018 10:26 AM   HDL 36 (L) 02/03/2016 11:45 AM   CHOLHDL 6.1 12/06/2018 10:26 AM   LDLCALC 139 (H) 12/06/2018 10:26 AM   LDLCALC 142 (H) 02/03/2016 11:45 AM    Wt Readings from Last  3 Encounters:  04/09/20 160 lb (72.6 kg)  11/08/18 150 lb (68 kg)  04/29/17 158 lb (71.7 kg)     Exam:    Vital Signs: Vital signs may also be detailed in the HPI BP 125/71 (BP Location: Left Arm, Patient Position: Sitting, Cuff Size: Normal)   Pulse 63   Ht 5\' 6"  (1.676 m)   Wt 160 lb (72.6 kg)   BMI 25.82 kg/m   Wt Readings from Last 3 Encounters:  04/09/20 160 lb (72.6 kg)  11/08/18 150 lb (68 kg)  04/29/17 158 lb (71.7 kg)   Temp Readings from Last 3 Encounters:  10/02/13 98.4 F (36.9 C)  04/20/12 97.5 F (36.4 C) (Temporal)  09/02/11 98.5 F (36.9 C) (Temporal)   BP Readings from Last 3 Encounters:  04/09/20 125/71  11/08/18 122/73  04/29/17 (!) 160/80   Pulse Readings from Last 3 Encounters:  04/09/20 63  11/08/18 63  04/29/17 (!) 52     Well nourished, well developed male in no acute distress. Constitutional:  oriented to person, place, and time. No distress.  Psychiatric:  normal mood and affect. behavior is normal. Thought content normal.    ASSESSMENT & PLAN:    Problem List Items Addressed This Visit      Cardiology Problems   Hyperlipidemia    Essential hypertension   PVD (peripheral vascular disease) (HCC)    Other Visit Diagnoses    Aortic root dilatation (HCC)    -  Primary   History of aortic valve replacement         HTN: Reports well controlled at home Stay on lisinopril Suggested he continue to take extra lisinopril 20 mg as needed for systolic pressure over 150 Well controlled "if he exercises" Recommend he continue to closely monitor pressures and call 05/01/17 if it runs high  AVR, aortic valve disease Last evaluated   Ascending aorta In 2013 was 4.4 cm mild to moderate dilatation of the ascending aorta measuring  44 mm. In 11/2018 Stable   COVID-19 Education: The signs and symptoms of COVID-19 were discussed with the patient and how to seek care for testing (follow up with PCP or arrange E-visit).  The importance of social distancing was discussed today.  Patient Risk:   After full review of this patients clinical status, I feel that they are at least moderate risk at this time.  Time:   Today, I have spent 25 minutes with the patient with telehealth technology discussing the cardiac and medical problems/diagnoses detailed above   Additional 10 min spent reviewing the chart prior to patient visit today   Medication Adjustments/Labs and Tests Ordered: Current medicines are reviewed at length with the patient today.  Concerns regarding medicines are outlined above.   Tests Ordered: No tests ordered   Medication Changes: No changes made   Disposition: Follow-up in 12 months   Signed, 12/2018, MD  [facility] [address], [place], [place]

## 2020-04-09 ENCOUNTER — Other Ambulatory Visit: Payer: Self-pay

## 2020-04-09 ENCOUNTER — Encounter: Payer: Self-pay | Admitting: Cardiovascular Disease

## 2020-04-09 ENCOUNTER — Telehealth (INDEPENDENT_AMBULATORY_CARE_PROVIDER_SITE_OTHER): Payer: Medicare Other | Admitting: Cardiovascular Disease

## 2020-04-09 VITALS — BP 125/71 | HR 63 | Ht 66.0 in | Wt 160.0 lb

## 2020-04-09 DIAGNOSIS — I77810 Thoracic aortic ectasia: Secondary | ICD-10-CM

## 2020-04-09 DIAGNOSIS — I739 Peripheral vascular disease, unspecified: Secondary | ICD-10-CM

## 2020-04-09 DIAGNOSIS — E782 Mixed hyperlipidemia: Secondary | ICD-10-CM | POA: Diagnosis not present

## 2020-04-09 DIAGNOSIS — I10 Essential (primary) hypertension: Secondary | ICD-10-CM | POA: Diagnosis not present

## 2020-04-09 DIAGNOSIS — Z952 Presence of prosthetic heart valve: Secondary | ICD-10-CM

## 2020-04-09 MED ORDER — LISINOPRIL 20 MG PO TABS: 20.0000 mg | ORAL_TABLET | Freq: Every day | ORAL | 3 refills | Status: AC

## 2020-04-09 NOTE — Patient Instructions (Signed)
Medication Instructions:  Continue lisinopril 20 mg daily with extra 20 mg for pressure >150  If you need a refill on your cardiac medications before your next appointment, please call your pharmacy.    Lab work: No new labs needed   If you have labs (blood work) drawn today and your tests are completely normal, you will receive your results only by: Marland Kitchen MyChart Message (if you have MyChart) OR . A paper copy in the mail If you have any lab test that is abnormal or we need to change your treatment, we will call you to review the results.   Testing/Procedures: No new testing needed   Follow-Up: At [facility], you and your health needs are our priority.  As part of our continuing mission to provide you with exceptional heart care, we have created designated Provider Care Teams.  These Care Teams include your primary Cardiologist (physician) and Advanced Practice Providers (APPs -  Physician Assistants and Nurse Practitioners) who all work together to provide you with the care you need, when you need it.  . You will need a follow up appointment in 12 months  . Providers on your designated Care Team:   . Nicolasa Ducking, NP . Eula Listen, PA-C . Marisue Ivan, PA-C  Any Other Special Instructions Will Be Listed Below (If Applicable).  COVID-19 Vaccine Information can be found at: [email or web address] For questions related to vaccine distribution or appointments, please email [email or web address] or call [phone].

## 2022-01-11 ENCOUNTER — Telehealth: Payer: Self-pay | Admitting: Cardiovascular Disease

## 2022-01-11 NOTE — Telephone Encounter (Signed)
Attempted to schedule 3 times, deleting from recall.
